# Patient Record
Sex: Male | Born: 1945
Health system: Southern US, Community
[De-identification: ages and names within clinical notes are randomized; demographics above are authoritative.]

## PROBLEM LIST (undated history)

## (undated) DIAGNOSIS — F1010 Alcohol abuse, uncomplicated: Secondary | ICD-10-CM

## (undated) DIAGNOSIS — Z5900 Homelessness unspecified: Secondary | ICD-10-CM

## (undated) DIAGNOSIS — R0600 Dyspnea, unspecified: Secondary | ICD-10-CM

## (undated) DIAGNOSIS — I10 Essential (primary) hypertension: Secondary | ICD-10-CM

## (undated) HISTORY — PX: NO PAST SURGERIES: SHX2092

---

## 1999-09-24 ENCOUNTER — Emergency Department (HOSPITAL_COMMUNITY): Admission: EM | Admit: 1999-09-24 | Discharge: 1999-09-24 | Payer: Self-pay | Admitting: *Deleted

## 1999-09-24 ENCOUNTER — Encounter: Payer: Self-pay | Admitting: *Deleted

## 1999-12-03 ENCOUNTER — Emergency Department (HOSPITAL_COMMUNITY): Admission: EM | Admit: 1999-12-03 | Discharge: 1999-12-03 | Payer: Self-pay | Admitting: Emergency Medicine

## 2012-08-01 ENCOUNTER — Emergency Department (HOSPITAL_COMMUNITY)
Admission: EM | Admit: 2012-08-01 | Discharge: 2012-08-01 | Disposition: A | Discharge status: Home / Self Care | Payer: Self-pay | Attending: Emergency Medicine | Admitting: Emergency Medicine

## 2012-08-01 ENCOUNTER — Encounter (HOSPITAL_COMMUNITY): Payer: Self-pay | Admitting: Emergency Medicine

## 2012-08-01 DIAGNOSIS — W010XXA Fall on same level from slipping, tripping and stumbling without subsequent striking against object, initial encounter: Secondary | ICD-10-CM | POA: Insufficient documentation

## 2012-08-01 DIAGNOSIS — Z23 Encounter for immunization: Secondary | ICD-10-CM | POA: Insufficient documentation

## 2012-08-01 DIAGNOSIS — Y9389 Activity, other specified: Secondary | ICD-10-CM | POA: Insufficient documentation

## 2012-08-01 DIAGNOSIS — Y929 Unspecified place or not applicable: Secondary | ICD-10-CM | POA: Insufficient documentation

## 2012-08-01 DIAGNOSIS — F17200 Nicotine dependence, unspecified, uncomplicated: Secondary | ICD-10-CM | POA: Insufficient documentation

## 2012-08-01 DIAGNOSIS — S61419A Laceration without foreign body of unspecified hand, initial encounter: Secondary | ICD-10-CM

## 2012-08-01 DIAGNOSIS — S61409A Unspecified open wound of unspecified hand, initial encounter: Secondary | ICD-10-CM | POA: Insufficient documentation

## 2012-08-01 MED ORDER — TETANUS-DIPHTH-ACELL PERTUSSIS 5-2.5-18.5 LF-MCG/0.5 IM SUSP
0.5000 mL | Freq: Once | INTRAMUSCULAR | Status: AC
  Administered 2012-08-01: 0.5 mL via INTRAMUSCULAR

## 2012-08-01 MED ORDER — CEPHALEXIN 500 MG PO CAPS: 500.0000 mg | ORAL_CAPSULE | Freq: Four times a day (QID) | ORAL | Status: DC

## 2012-08-01 MED ORDER — TETANUS-DIPHTHERIA TOXOIDS TD 5-2 LFU IM INJ
0.5000 mL | INJECTION | Freq: Once | INTRAMUSCULAR | Status: DC
  Filled 2012-08-01: qty 0.5

## 2012-08-01 NOTE — ED Notes (Signed)
Pt called but no answer in lobby.

## 2012-08-01 NOTE — ED Notes (Signed)
Deep laceration to right hand. States he fell on glass last night about 0100. "Didn't notice that it was so bad". Last tetanus "many years ago".

## 2012-08-01 NOTE — ED Provider Notes (Signed)
History   This chart was scribed for Steven Shi, MD, by Frederik Pear, ER scribe. The patient was seen in room TR07C/TR07C and the patient's care was started at 1239.    CSN: [id number]  Arrival date & time 08/01/12  1239   None     Chief Complaint  Patient presents with  . Laceration    (Consider location/radiation/quality/duration/timing/severity/associated sxs/prior treatment) HPI  Steven Arnold is a 66 y.o. male brought in by EMS who presents to the Emergency Department complaining of a constant, deep laceration to the right hand that occurred last night around 0100 after he tripped and fell on some glass. EMS reports that the bleeding is controlled and applied a bandage. He denies having a current tetanus shot.   History reviewed. No pertinent past medical history.  History reviewed. No pertinent past surgical history.  No family history on file.  History  Substance Use Topics  . Smoking status: Light Tobacco Smoker  . Smokeless tobacco: Not on file  . Alcohol Use: Yes      Review of Systems A complete 10 system review of systems was obtained and all systems are negative except as noted in the HPI and PMH.   Allergies  Review of patient's allergies indicates no known allergies.  Home Medications   Current Outpatient Rx  Name  Route  Sig  Dispense  Refill  . CEPHALEXIN 500 MG PO CAPS   Oral   Take 1 capsule (500 mg total) by mouth 4 (four) times daily.   20 capsule   0     BP 145/86  Pulse 92  Temp 98.9 F (37.2 C) (Oral)  Resp 16  SpO2 99%  Physical Exam  Nursing note and vitals reviewed. Constitutional: He is oriented to person, place, and time. He appears well-developed and well-nourished. No distress.  HENT:  Head: Normocephalic and atraumatic.  Eyes: Pupils are equal, round, and reactive to light.  Neck: Normal range of motion.  Cardiovascular: Normal rate and intact distal pulses.   Pulmonary/Chest: No respiratory distress.    Abdominal: Normal appearance. He exhibits no distension.  Musculoskeletal: Normal range of motion.       Hands: Neurological: He is alert and oriented to person, place, and time. No cranial nerve deficit.  Skin: Skin is warm and dry. No rash noted.  Psychiatric: He has a normal mood and affect. His behavior is normal.    ED Course  LACERATION REPAIR Performed by: Steven Arnold Authorized by: Steven Arnold Consent: Verbal consent obtained. Risks and benefits: risks, benefits and alternatives were discussed Patient understanding: patient states understanding of the procedure being performed Body area: upper extremity Location details: right hand Laceration length: 6 cm Contamination: The wound is contaminated. Foreign bodies: no foreign bodies Tendon involvement: none Nerve involvement: none Vascular damage: no Anesthesia: local infiltration Local anesthetic: lidocaine 2% without epinephrine Anesthetic total: 7 ml Patient sedated: no Preparation: Patient was prepped and draped in the usual sterile fashion. Irrigation method: jet lavage Amount of cleaning: extensive Debridement: none Degree of undermining: none Skin closure: 4-0 nylon Number of sutures: 8 Technique: running Dressing: antibiotic ointment Patient tolerance: Patient tolerated the procedure well with no immediate complications.   (including critical care time)  DIAGNOSTIC STUDIES: Oxygen Saturation is 99% on room air, normal by my interpretation.    COORDINATION OF CARE:  13:17- Discussed planned course of treatment with the patient, including a laceration repair, who is agreeable at this time.   Labs  Reviewed - No data to display No results found.   1. Hand laceration       MDM  I personally performed the services described in this documentation, which was scribed in my presence. The recorded information has been reviewed and considered.       Steven Shi, MD 08/01/12 2225

## 2012-08-01 NOTE — ED Notes (Signed)
EMS vital signs BP 142/90, HR 70 RR 16 98% room air

## 2012-08-01 NOTE — ED Notes (Addendum)
Patient tripped fell last night 0000 right hand laceration EMS reported 2 inches deep controlled with bandaged applied by EMS radial pulse +2. Full sensation

## 2014-08-10 ENCOUNTER — Emergency Department (HOSPITAL_COMMUNITY): Payer: Commercial Managed Care - HMO

## 2014-08-10 ENCOUNTER — Emergency Department (HOSPITAL_COMMUNITY)
Admission: EM | Admit: 2014-08-10 | Discharge: 2014-08-11 | Disposition: A | Discharge status: Home / Self Care | Payer: Commercial Managed Care - HMO | Attending: Emergency Medicine | Admitting: Emergency Medicine

## 2014-08-10 ENCOUNTER — Encounter (HOSPITAL_COMMUNITY): Payer: Self-pay | Admitting: Emergency Medicine

## 2014-08-10 DIAGNOSIS — Y998 Other external cause status: Secondary | ICD-10-CM | POA: Insufficient documentation

## 2014-08-10 DIAGNOSIS — Z792 Long term (current) use of antibiotics: Secondary | ICD-10-CM | POA: Insufficient documentation

## 2014-08-10 DIAGNOSIS — Y9389 Activity, other specified: Secondary | ICD-10-CM | POA: Insufficient documentation

## 2014-08-10 DIAGNOSIS — F10920 Alcohol use, unspecified with intoxication, uncomplicated: Secondary | ICD-10-CM

## 2014-08-10 DIAGNOSIS — F10120 Alcohol abuse with intoxication, uncomplicated: Secondary | ICD-10-CM | POA: Insufficient documentation

## 2014-08-10 DIAGNOSIS — W19XXXA Unspecified fall, initial encounter: Secondary | ICD-10-CM

## 2014-08-10 DIAGNOSIS — W08XXXA Fall from other furniture, initial encounter: Secondary | ICD-10-CM | POA: Insufficient documentation

## 2014-08-10 DIAGNOSIS — Y92149 Unspecified place in prison as the place of occurrence of the external cause: Secondary | ICD-10-CM | POA: Insufficient documentation

## 2014-08-10 DIAGNOSIS — Z720 Tobacco use: Secondary | ICD-10-CM | POA: Insufficient documentation

## 2014-08-10 DIAGNOSIS — S4992XA Unspecified injury of left shoulder and upper arm, initial encounter: Secondary | ICD-10-CM | POA: Diagnosis not present

## 2014-08-10 NOTE — ED Notes (Addendum)
Pt [facility] EMS from jail, deputies report pt fell from bench onto the floor. Jail unwilling to keep patient in drunk tank due to intoxication.  Pt rambling, unable to understand completely what patient is attempting to explain, pt holding L shoulder.

## 2014-08-10 NOTE — ED Provider Notes (Signed)
CSN: [id number]     Arrival date & time 08/10/14  2128 History   First MD Initiated Contact with Patient 08/10/14 2248     Chief Complaint  Patient presents with  . Alcohol Intoxication  . Fall     (Consider location/radiation/quality/duration/timing/severity/associated sxs/prior Treatment) HPI Comments: Patient here from jail after falling from a bench onto the floor. No loss of consciousness. No head injury. Pain of left shoulder pain. Denies any distal numbness or tingling. No hip or back or chest pain. Denies any abdominal pain. No treatment use prior to arrival and patient transported here for further evaluation. He admits to drinking copious amounts of alcohol this evening  Patient is a 69 y.o. male presenting with intoxication and fall. The history is provided by the patient.  Alcohol Intoxication  Fall    History reviewed. No pertinent past medical history. History reviewed. No pertinent past surgical history. No family history on file. History  Substance Use Topics  . Smoking status: Light Tobacco Smoker  . Smokeless tobacco: Not on file  . Alcohol Use: Yes    Review of Systems  All other systems reviewed and are negative.     Allergies  Review of patient's allergies indicates no known allergies.  Home Medications   Prior to Admission medications   Medication Sig Start Date End Date Taking? Authorizing Provider  cephALEXin (KEFLEX) 500 MG capsule Take 1 capsule (500 mg total) by mouth 4 (four) times daily. 08/01/12   Nelia Shi, MD   BP 170/96 mmHg  Pulse 102  Temp(Src) 97.5 F (36.4 C) (Oral)  Resp 20  Wt 145 lb (65.772 kg)  SpO2 94% Physical Exam  Constitutional: He is oriented to person, place, and time. He appears well-developed and well-nourished.  Non-toxic appearance. No distress.  HENT:  Head: Normocephalic and atraumatic.  Eyes: Conjunctivae, EOM and lids are normal. Pupils are equal, round, and reactive to light.  Neck: Normal range of  motion. Neck supple. No tracheal deviation present. No thyroid mass present.  Cardiovascular: Normal rate, regular rhythm and normal heart sounds.  Exam reveals no gallop.   No murmur heard. Pulmonary/Chest: Effort normal and breath sounds normal. No stridor. No respiratory distress. He has no decreased breath sounds. He has no wheezes. He has no rhonchi. He has no rales.  Abdominal: Soft. Normal appearance and bowel sounds are normal. He exhibits no distension. There is no tenderness. There is no rebound and no CVA tenderness.  Musculoskeletal: Normal range of motion. He exhibits no edema or tenderness.       Left shoulder: He exhibits pain. He exhibits normal range of motion, no bony tenderness, no swelling, no deformity and normal strength.  Neurological: He is alert and oriented to person, place, and time. He has normal strength. No cranial nerve deficit or sensory deficit. GCS eye subscore is 4. GCS verbal subscore is 5. GCS motor subscore is 6.  Skin: Skin is warm and dry. No abrasion and no rash noted.  Psychiatric: He has a normal mood and affect. His speech is normal and behavior is normal.  Nursing note and vitals reviewed.   ED Course  Procedures (including critical care time) Labs Review Labs Reviewed - No data to display  Imaging Review Dg Shoulder Left  08/10/2014   CLINICAL DATA:  Fall. Patient is intoxicated difficult to understand. Holding the left shoulder.  EXAM: LEFT SHOULDER - 2+ VIEW  COMPARISON:  None.  FINDINGS: Degenerative changes in the glenohumeral joint. No evidence  of acute fracture or dislocation. Exostosis arising from the mid humeral shaft. No destructive bone lesion.  IMPRESSION: Degenerative changes in the left shoulder. No acute fracture or dislocation.   Electronically Signed   By: Burman NievesWilliam  Stevens M.D.   On: 08/10/2014 22:17     EKG Interpretation None      MDM   Final diagnoses:  Fall    Left shoulder x-rays negative. Patient clinically  intoxicated and will be allowed to sober up    Toy BakerAnthony T Rulon Abdalla, MD 08/10/14 [phone]

## 2014-08-11 NOTE — ED Provider Notes (Signed)
Pt ambulated to the bathroom on his own without significant difficulty. Dc home at this time  Lyanne CoKevin M Linzy Darling, MD 08/11/14 0040

## 2017-12-23 DIAGNOSIS — R69 Illness, unspecified: Secondary | ICD-10-CM | POA: Diagnosis not present

## 2018-04-10 ENCOUNTER — Emergency Department (HOSPITAL_COMMUNITY)
Admission: EM | Admit: 2018-04-10 | Discharge: 2018-04-10 | Disposition: A | Discharge status: Home / Self Care | Payer: Medicare HMO | Attending: Emergency Medicine | Admitting: Emergency Medicine

## 2018-04-10 ENCOUNTER — Encounter (HOSPITAL_COMMUNITY): Payer: Self-pay

## 2018-04-10 ENCOUNTER — Emergency Department (HOSPITAL_COMMUNITY): Payer: Medicare HMO

## 2018-04-10 ENCOUNTER — Other Ambulatory Visit: Payer: Self-pay

## 2018-04-10 DIAGNOSIS — F17210 Nicotine dependence, cigarettes, uncomplicated: Secondary | ICD-10-CM | POA: Diagnosis not present

## 2018-04-10 DIAGNOSIS — W19XXXA Unspecified fall, initial encounter: Secondary | ICD-10-CM | POA: Diagnosis not present

## 2018-04-10 DIAGNOSIS — Y92480 Sidewalk as the place of occurrence of the external cause: Secondary | ICD-10-CM | POA: Diagnosis not present

## 2018-04-10 DIAGNOSIS — Y939 Activity, unspecified: Secondary | ICD-10-CM | POA: Diagnosis not present

## 2018-04-10 DIAGNOSIS — R51 Headache: Secondary | ICD-10-CM | POA: Diagnosis not present

## 2018-04-10 DIAGNOSIS — S0993XA Unspecified injury of face, initial encounter: Secondary | ICD-10-CM | POA: Diagnosis not present

## 2018-04-10 DIAGNOSIS — Z23 Encounter for immunization: Secondary | ICD-10-CM | POA: Insufficient documentation

## 2018-04-10 DIAGNOSIS — T679XXA Effect of heat and light, unspecified, initial encounter: Secondary | ICD-10-CM | POA: Diagnosis not present

## 2018-04-10 DIAGNOSIS — Y999 Unspecified external cause status: Secondary | ICD-10-CM | POA: Diagnosis not present

## 2018-04-10 DIAGNOSIS — R000 Tachycardia, unspecified: Secondary | ICD-10-CM | POA: Diagnosis not present

## 2018-04-10 DIAGNOSIS — S0990XA Unspecified injury of head, initial encounter: Secondary | ICD-10-CM | POA: Diagnosis not present

## 2018-04-10 DIAGNOSIS — R42 Dizziness and giddiness: Secondary | ICD-10-CM | POA: Diagnosis not present

## 2018-04-10 DIAGNOSIS — S01112A Laceration without foreign body of left eyelid and periocular area, initial encounter: Secondary | ICD-10-CM

## 2018-04-10 DIAGNOSIS — S199XXA Unspecified injury of neck, initial encounter: Secondary | ICD-10-CM | POA: Diagnosis not present

## 2018-04-10 DIAGNOSIS — W101XXA Fall (on)(from) sidewalk curb, initial encounter: Secondary | ICD-10-CM | POA: Insufficient documentation

## 2018-04-10 DIAGNOSIS — R55 Syncope and collapse: Secondary | ICD-10-CM | POA: Diagnosis not present

## 2018-04-10 LAB — CBC WITH DIFFERENTIAL/PLATELET
ABS IMMATURE GRANULOCYTES: 0 10*3/uL (ref 0.0–0.1)
Basophils Absolute: 0 10*3/uL (ref 0.0–0.1)
Basophils Relative: 1 %
Eosinophils Absolute: 0.1 10*3/uL (ref 0.0–0.7)
Eosinophils Relative: 2 %
HEMATOCRIT: 34.4 % — AB (ref 39.0–52.0)
Hemoglobin: 11.3 g/dL — ABNORMAL LOW (ref 13.0–17.0)
IMMATURE GRANULOCYTES: 1 %
LYMPHS ABS: 0.8 10*3/uL (ref 0.7–4.0)
Lymphocytes Relative: 22 %
MCH: 33.6 pg (ref 26.0–34.0)
MCHC: 32.8 g/dL (ref 30.0–36.0)
MCV: 102.4 fL — AB (ref 78.0–100.0)
MONO ABS: 0.4 10*3/uL (ref 0.1–1.0)
MONOS PCT: 12 %
NEUTROS ABS: 2.3 10*3/uL (ref 1.7–7.7)
NEUTROS PCT: 62 %
Platelets: 160 10*3/uL (ref 150–400)
RBC: 3.36 MIL/uL — ABNORMAL LOW (ref 4.22–5.81)
RDW: 12.5 % (ref 11.5–15.5)
WBC: 3.7 10*3/uL — ABNORMAL LOW (ref 4.0–10.5)

## 2018-04-10 LAB — BASIC METABOLIC PANEL
ANION GAP: 14 (ref 5–15)
BUN: 16 mg/dL (ref 8–23)
CO2: 21 mmol/L — AB (ref 22–32)
Calcium: 8.8 mg/dL — ABNORMAL LOW (ref 8.9–10.3)
Chloride: 99 mmol/L (ref 98–111)
Creatinine, Ser: 1.21 mg/dL (ref 0.61–1.24)
GFR calc Af Amer: >60 mL/min (ref >60)
GFR calc non Af Amer: 59 mL/min — ABNORMAL LOW (ref >60)
GLUCOSE: 113 mg/dL — AB (ref 70–99)
POTASSIUM: 3.5 mmol/L (ref 3.5–5.1)
Sodium: 134 mmol/L — ABNORMAL LOW (ref 135–145)

## 2018-04-10 LAB — ETHANOL: Alcohol, Ethyl (B): <10 mg/dL (ref <10)

## 2018-04-10 MED ORDER — LIDOCAINE-EPINEPHRINE 2 %-1:100000 IJ SOLN
INTRAMUSCULAR | Status: AC
  Filled 2018-04-10: qty 1

## 2018-04-10 MED ORDER — FLUORESCEIN SODIUM 1 MG OP STRP
1.0000 | ORAL_STRIP | Freq: Once | OPHTHALMIC | Status: AC
  Administered 2018-04-10: 1 via OPHTHALMIC
  Filled 2018-04-10: qty 1

## 2018-04-10 MED ORDER — LORAZEPAM 1 MG PO TABS
1.0000 mg | ORAL_TABLET | Freq: Once | ORAL | Status: AC
  Administered 2018-04-10: 1 mg via ORAL
  Filled 2018-04-10: qty 1

## 2018-04-10 MED ORDER — ERYTHROMYCIN 5 MG/GM OP OINT
1.0000 "application " | TOPICAL_OINTMENT | Freq: Once | OPHTHALMIC | Status: AC
  Administered 2018-04-10: 1 via OPHTHALMIC

## 2018-04-10 MED ORDER — ERYTHROMYCIN 5 MG/GM OP OINT: 1.0000 "application " | TOPICAL_OINTMENT | Freq: Three times a day (TID) | OPHTHALMIC | 1 refills | Status: AC

## 2018-04-10 MED ORDER — TETANUS-DIPHTH-ACELL PERTUSSIS 5-2.5-18.5 LF-MCG/0.5 IM SUSP
0.5000 mL | Freq: Once | INTRAMUSCULAR | Status: AC
  Administered 2018-04-10: 0.5 mL via INTRAMUSCULAR
  Filled 2018-04-10: qty 0.5

## 2018-04-10 MED ORDER — TOBRAMYCIN-DEXAMETHASONE 0.3-0.1 % OP OINT
TOPICAL_OINTMENT | OPHTHALMIC | Status: AC
  Filled 2018-04-10: qty 3.5

## 2018-04-10 MED ORDER — LIDOCAINE HCL (PF) 1 % IJ SOLN
5.0000 mL | Freq: Once | INTRAMUSCULAR | Status: AC
  Administered 2018-04-10: 5 mL via INTRADERMAL
  Filled 2018-04-10: qty 5

## 2018-04-10 MED ORDER — BSS IO SOLN
INTRAOCULAR | Status: AC
  Filled 2018-04-10: qty 30

## 2018-04-10 MED ORDER — LIDOCAINE-EPINEPHRINE (PF) 2 %-1:200000 IJ SOLN
10.0000 mL | Freq: Once | INTRAMUSCULAR | Status: AC
  Administered 2018-04-10: 10 mL via INTRADERMAL

## 2018-04-10 MED ORDER — TETRACAINE HCL 0.5 % OP SOLN
1.0000 [drp] | Freq: Once | OPHTHALMIC | Status: AC
  Administered 2018-04-10: 1 [drp] via OPHTHALMIC
  Filled 2018-04-10: qty 4

## 2018-04-10 NOTE — Discharge Instructions (Addendum)
Dr. Allena Katz has evaluated your eye and has fixed your laceration This will take some time to heal - you should follow up with the specialist above for further evaluation and suture removal. If you can't see the specialist above, you can see Dr. Allena Katz for follow up  ER for worsening symptoms.

## 2018-04-10 NOTE — Consult Note (Signed)
[facility]                                                                               04/10/2018                                               Ophthalmology Consultation                                         Consult requested by: Dr. Hyacinth Meeker   Reason for consultation:  Left lower eyelid trauma with eyelid laceration  HPI: 72 y.o male states he was bending over to pick up change, lost balance and fell forward, striking his face on gravel.  He denies LOC.  He drinks alcohol nightly.  Pertinent Medical History:   Active Ambulatory Problems    Diagnosis Date Noted  . No Active Ambulatory Problems   Resolved Ambulatory Problems    Diagnosis Date Noted  . No Resolved Ambulatory Problems   No Additional Past Medical History    Pertinent Ophthalmic History: None    Current Eye Medications: none  Systemic medications on admission:   (Not in a hospital admission)     ROS: Negative (GI/GU/Neuro/CV/Resp/HENT/Integ/Psych/MS) Positive for eyelid injury without blurry vision  Visual Fields: FTC OU      Pupils:  Pharmacologically dilated at my direction before exam   Equal, brisk, no APD     Near acuity:   J2     J2  TA:       Normal to palpation OU      External:   OD:  Normal     OS:  Eyelid laceration - full thickness with abrasion to adjacent skin  Anterior segment exam:  By penlight     Conjunctiva:  OD:  Quiet      OS:  Quiet     Cornea:    OD: Clear, no fluorescein stain       OS: Clear, no fluorescein stain      Anterior Chamber:   OD:  Deep/quiet      OS:  Deep/quiet     Iris:    OD:  Normal       OS:  Normal      Lens:    OD:          OS:            Impression:  Left lower eyelid laceration - full thickness Left lower eyelid skin abrasion Left eye trauma Left eyelid pain     Recommendations/Plan:    Left lower eyelid laceration repair Apply topical Erythromycin to wound and skin 3-4x/day until healed.  Procedure note:  Left lower  eyelid laceration repair The left lower eyelid was prepped and cleaned with betadine.  A fenestrated drape was placed.  2% lidocaine was injected to attain anesthesia.  5-0 Vicryl was placed through the tarsus to reapproximate the displaced tarsal laceration.  7-0 vicryl was placed through  the eyelid margin the reapproximate the margin.  These suture were tied in place.  Multiple interrupted 7-0 Vicryl sutures were placed to close the overlying skin.  The eye was cleaned and erythromycin ointment placed over the wound and open skin.  I've discussed these findings with the attending. Please contact our office with any questions or concerns at [phone].   Harrold Donath

## 2018-04-10 NOTE — ED Provider Notes (Signed)
[facility] EMERGENCY DEPARTMENT Provider Note   CSN: [id number] Arrival date & time: 04/10/18  1710     History   Chief Complaint Chief Complaint  Patient presents with  . Loss of Consciousness    HPI Steven Arnold is a 72 y.o. male.  HPI  72 year old male, history of alcohol abuse, denies any other chronic medical conditions, states that he was on the sidewalk bending over to pick up some change when he lost his balance falling forward and striking his left periorbital area in the gravel.  There is a question as to whether there is a loss of consciousness though the patient denies this of the head minimally.  He does endorse drinking alcohol nightly, it seems like he drinks heavily, he states he had one 40 ounce beer last night.  The patient denies any other chronic medical history, takes no daily medicines, and has no other injuries other than his face.  He has no other complaints other than his left periorbital injury.  No changes in vision.  Symptoms were acute in onset and occurred just prior to arrival.  History reviewed. No pertinent past medical history.  There are no active problems to display for this patient.   History reviewed. No pertinent surgical history.      Home Medications    Prior to Admission medications   Medication Sig Start Date End Date Taking? Authorizing Provider  cephALEXin (KEFLEX) 500 MG capsule Take 1 capsule (500 mg total) by mouth 4 (four) times daily. 08/01/12   Nelva Nay, MD  erythromycin ophthalmic ointment Place 1 application into the left eye 3 (three) times daily for 5 days. Place 1/2 inch ribbon of ointment in the affected eye 4 times a day 04/10/18 04/15/18  Eber Hong, MD    Family History History reviewed. No pertinent family history.  Social History Social History   Tobacco Use  . Smoking status: Heavy Tobacco Smoker    Packs/day: 0.50  . Smokeless tobacco: Never Used  Substance Use Topics  .  Alcohol use: Yes    Alcohol/week: 6.0 standard drinks    Types: 6 Cans of beer per week  . Drug use: No     Allergies   Patient has no known allergies.   Review of Systems Review of Systems  All other systems reviewed and are negative.    Physical Exam Updated Vital Signs BP (!) 159/98   Pulse 73   Resp 18   Ht 1.676 m (5\' 6" )   Wt 65.8 kg   SpO2 99%   BMI 23.40 kg/m   Physical Exam  Constitutional: He appears well-developed and well-nourished. No distress.  HENT:  Head: Normocephalic.  Mouth/Throat: Oropharynx is clear and moist. No oropharyngeal exudate.  Laceration involving the lower lid at the lid margin extending onto the lower lid with some degloving of the lower lid and puncture type bleeding to the area just superior to the left brow.  Eyes: Pupils are equal, round, and reactive to light. Conjunctivae and EOM are normal. Right eye exhibits no discharge. Left eye exhibits no discharge. No scleral icterus.  Periorbital injury as discussed above  Neck: No JVD present. No thyromegaly present.  Mild tenderness over the cervical spine  Cardiovascular: Normal rate, regular rhythm, normal heart sounds and intact distal pulses. Exam reveals no gallop and no friction rub.  No murmur heard. Pulmonary/Chest: Effort normal and breath sounds normal. No respiratory distress. He has no wheezes. He has no rales.  Abdominal: Soft. Bowel sounds are normal. He exhibits no distension and no mass. There is no tenderness.  Musculoskeletal: Normal range of motion. He exhibits no edema or tenderness.  All 4 extremities with soft compartments and supple joints diffusely, able to move all 4 extremities with normal range of motion and strength  Lymphadenopathy:    He has no cervical adenopathy.  Neurological: He is alert. Coordination normal.  Mild tremor of his upper extremities, speech is clear, no facial droop, answers questions and follows commands without difficulty, seemingly normal  strength diffusely  Skin: Skin is warm and dry. No rash noted. No erythema.  Laceration as noted above.  Psychiatric: He has a normal mood and affect. His behavior is normal.  Nursing note and vitals reviewed.    ED Treatments / Results  Labs (all labs ordered are listed, but only abnormal results are displayed) Labs Reviewed  CBC WITH DIFFERENTIAL/PLATELET - Abnormal; Notable for the following components:      Result Value   WBC 3.7 (*)    RBC 3.36 (*)    Hemoglobin 11.3 (*)    HCT 34.4 (*)    MCV 102.4 (*)    All other components within normal limits  BASIC METABOLIC PANEL - Abnormal; Notable for the following components:   Sodium 134 (*)    CO2 21 (*)    Glucose, Bld 113 (*)    Calcium 8.8 (*)    GFR calc non Af Amer 59 (*)    All other components within normal limits  ETHANOL    EKG EKG Interpretation  Date/Time:  Sunday April 10 2018 17:17:59 EDT Ventricular Rate:  85 PR Interval:    QRS Duration: 89 QT Interval:  399 QTC Calculation: 475 R Axis:   68 Text Interpretation:  Sinus rhythm Left ventricular hypertrophy since 2001, no significant changes seen Confirmed by Eber Hong ([id number]) on 04/10/2018 5:53:21 PM   Radiology Ct Head Wo Contrast  Result Date: 04/10/2018 CLINICAL DATA:  Syncopal episode resulting in a fall, hitting his left face on asphalt with a resultant left inferior eyelid laceration. Headache. EXAM: CT HEAD WITHOUT CONTRAST CT MAXILLOFACIAL WITHOUT CONTRAST CT CERVICAL SPINE WITHOUT CONTRAST TECHNIQUE: Multidetector CT imaging of the head, cervical spine, and maxillofacial structures were performed using the standard protocol without intravenous contrast. Multiplanar CT image reconstructions of the cervical spine and maxillofacial structures were also generated. COMPARISON:  None. FINDINGS: CT HEAD FINDINGS Brain: Diffusely enlarged ventricles and subarachnoid spaces. Patchy white matter low density in both cerebral hemispheres. No intracranial  hemorrhage, mass lesion or CT evidence of acute infarction. Vascular: No hyperdense vessel or unexpected calcification. Skull: Normal. Negative for fracture or focal lesion. Other: None. CT MAXILLOFACIAL FINDINGS Osseous: No fractures. Orbits: Negative. No traumatic or inflammatory finding. Sinuses: Mild right maxillary sinus mucosal thickening. Small left maxillary sinus retention cyst. Soft tissues: Mild left periorbital soft tissue swelling and inferior laceration. Mild skin irregularity of the left cheek area with small superficial foreign bodies. CT CERVICAL SPINE FINDINGS Alignment: Mild reversal of the normal cervical lordosis. No subluxations. Skull base and vertebrae: No acute fracture. No primary bone lesion or focal pathologic process. Soft tissues and spinal canal: No prevertebral fluid or swelling. No visible canal hematoma. Disc levels: Multilevel degenerative changes. Anterior bone fusion at the C5-6 and C6-7 levels. Upper chest: Clear lung apices. Other: Bilateral carotid artery calcifications. IMPRESSION: 1. No skull fracture or intracranial hemorrhage. 2. No maxillofacial fracture. 3. No cervical spine fracture or subluxation. 4. Mild  to moderate diffuse cerebral and cerebellar atrophy. 5. Mild chronic small vessel white matter ischemic changes in both cerebral hemispheres. 6. Cervical spine degenerative changes. 7. Bilateral carotid artery atheromatous calcifications. 8. Mild chronic right maxillary sinusitis. 9. Left cheek abrasions with small superficial foreign bodies. Electronically Signed   By: Beckie Salts M.D.   On: 04/10/2018 19:34   Ct Cervical Spine Wo Contrast  Result Date: 04/10/2018 CLINICAL DATA:  Syncopal episode resulting in a fall, hitting his left face on asphalt with a resultant left inferior eyelid laceration. Headache. EXAM: CT HEAD WITHOUT CONTRAST CT MAXILLOFACIAL WITHOUT CONTRAST CT CERVICAL SPINE WITHOUT CONTRAST TECHNIQUE: Multidetector CT imaging of the head, cervical  spine, and maxillofacial structures were performed using the standard protocol without intravenous contrast. Multiplanar CT image reconstructions of the cervical spine and maxillofacial structures were also generated. COMPARISON:  None. FINDINGS: CT HEAD FINDINGS Brain: Diffusely enlarged ventricles and subarachnoid spaces. Patchy white matter low density in both cerebral hemispheres. No intracranial hemorrhage, mass lesion or CT evidence of acute infarction. Vascular: No hyperdense vessel or unexpected calcification. Skull: Normal. Negative for fracture or focal lesion. Other: None. CT MAXILLOFACIAL FINDINGS Osseous: No fractures. Orbits: Negative. No traumatic or inflammatory finding. Sinuses: Mild right maxillary sinus mucosal thickening. Small left maxillary sinus retention cyst. Soft tissues: Mild left periorbital soft tissue swelling and inferior laceration. Mild skin irregularity of the left cheek area with small superficial foreign bodies. CT CERVICAL SPINE FINDINGS Alignment: Mild reversal of the normal cervical lordosis. No subluxations. Skull base and vertebrae: No acute fracture. No primary bone lesion or focal pathologic process. Soft tissues and spinal canal: No prevertebral fluid or swelling. No visible canal hematoma. Disc levels: Multilevel degenerative changes. Anterior bone fusion at the C5-6 and C6-7 levels. Upper chest: Clear lung apices. Other: Bilateral carotid artery calcifications. IMPRESSION: 1. No skull fracture or intracranial hemorrhage. 2. No maxillofacial fracture. 3. No cervical spine fracture or subluxation. 4. Mild to moderate diffuse cerebral and cerebellar atrophy. 5. Mild chronic small vessel white matter ischemic changes in both cerebral hemispheres. 6. Cervical spine degenerative changes. 7. Bilateral carotid artery atheromatous calcifications. 8. Mild chronic right maxillary sinusitis. 9. Left cheek abrasions with small superficial foreign bodies. Electronically Signed   By:  Beckie Salts M.D.   On: 04/10/2018 19:34   Ct Maxillofacial Wo Contrast  Result Date: 04/10/2018 CLINICAL DATA:  Syncopal episode resulting in a fall, hitting his left face on asphalt with a resultant left inferior eyelid laceration. Headache. EXAM: CT HEAD WITHOUT CONTRAST CT MAXILLOFACIAL WITHOUT CONTRAST CT CERVICAL SPINE WITHOUT CONTRAST TECHNIQUE: Multidetector CT imaging of the head, cervical spine, and maxillofacial structures were performed using the standard protocol without intravenous contrast. Multiplanar CT image reconstructions of the cervical spine and maxillofacial structures were also generated. COMPARISON:  None. FINDINGS: CT HEAD FINDINGS Brain: Diffusely enlarged ventricles and subarachnoid spaces. Patchy white matter low density in both cerebral hemispheres. No intracranial hemorrhage, mass lesion or CT evidence of acute infarction. Vascular: No hyperdense vessel or unexpected calcification. Skull: Normal. Negative for fracture or focal lesion. Other: None. CT MAXILLOFACIAL FINDINGS Osseous: No fractures. Orbits: Negative. No traumatic or inflammatory finding. Sinuses: Mild right maxillary sinus mucosal thickening. Small left maxillary sinus retention cyst. Soft tissues: Mild left periorbital soft tissue swelling and inferior laceration. Mild skin irregularity of the left cheek area with small superficial foreign bodies. CT CERVICAL SPINE FINDINGS Alignment: Mild reversal of the normal cervical lordosis. No subluxations. Skull base and vertebrae: No acute fracture. No primary  bone lesion or focal pathologic process. Soft tissues and spinal canal: No prevertebral fluid or swelling. No visible canal hematoma. Disc levels: Multilevel degenerative changes. Anterior bone fusion at the C5-6 and C6-7 levels. Upper chest: Clear lung apices. Other: Bilateral carotid artery calcifications. IMPRESSION: 1. No skull fracture or intracranial hemorrhage. 2. No maxillofacial fracture. 3. No cervical spine  fracture or subluxation. 4. Mild to moderate diffuse cerebral and cerebellar atrophy. 5. Mild chronic small vessel white matter ischemic changes in both cerebral hemispheres. 6. Cervical spine degenerative changes. 7. Bilateral carotid artery atheromatous calcifications. 8. Mild chronic right maxillary sinusitis. 9. Left cheek abrasions with small superficial foreign bodies. Electronically Signed   By: Beckie Salts M.D.   On: 04/10/2018 19:34    Procedures Procedures (including critical care time)  Medications Ordered in ED Medications  Tdap (BOOSTRIX) injection 0.5 mL (0.5 mLs Intramuscular Given 04/10/18 1817)  lidocaine (PF) (XYLOCAINE) 1 % injection 5 mL (5 mLs Intradermal Given 04/10/18 1815)  tetracaine (PONTOCAINE) 0.5 % ophthalmic solution 1 drop (1 drop Both Eyes Given 04/10/18 1832)  fluorescein ophthalmic strip 1 strip (1 strip Both Eyes Given 04/10/18 1832)  LORazepam (ATIVAN) tablet 1 mg (1 mg Oral Given 04/10/18 1832)     Initial Impression / Assessment and Plan / ED Course  I have reviewed the triage vital signs and the nursing notes.  Pertinent labs & imaging results that were available during my care of the patient were reviewed by me and considered in my medical decision making (see chart for details).         We will discuss with ophthalmology regarding potential lid and facial repair, does not seem to involve the eye conjunctive etc.  Will need formal fluorescein exam.  Discussed with Dr. Allena Katz (Optho) who will come to see the patient  Dr. Allena Katz has very kindly fix the patient's laceration, has referred to plastics ophthalmology, Dr. Dimas Millin, will prescribe erythromycin ointment, the patient instructed on wound care, stable for discharge  Final Clinical Impressions(s) / ED Diagnoses   Final diagnoses:  Left eyelid laceration, initial encounter    ED Discharge Orders         Ordered    erythromycin ophthalmic ointment  3 times daily     04/10/18 2128             Eber Hong, MD 04/10/18 2131

## 2018-04-10 NOTE — ED Triage Notes (Signed)
Per EMS, pt with a c/o heat exposure, syncope, and inferior eyelid laceration. It was reported that the pt was leaning over to pick up some change, lost consciousness, and hit his orbit on asphalt. The pt has denied vision problems. No neck or back pain.

## 2018-07-21 ENCOUNTER — Emergency Department (HOSPITAL_COMMUNITY): Payer: Medicare HMO

## 2018-07-21 ENCOUNTER — Encounter (HOSPITAL_COMMUNITY): Payer: Self-pay

## 2018-07-21 ENCOUNTER — Emergency Department (HOSPITAL_COMMUNITY)
Admission: EM | Admit: 2018-07-21 | Discharge: 2018-07-21 | Disposition: A | Discharge status: Home / Self Care | Payer: Medicare HMO | Attending: Emergency Medicine | Admitting: Emergency Medicine

## 2018-07-21 DIAGNOSIS — W010XXA Fall on same level from slipping, tripping and stumbling without subsequent striking against object, initial encounter: Secondary | ICD-10-CM | POA: Insufficient documentation

## 2018-07-21 DIAGNOSIS — F17210 Nicotine dependence, cigarettes, uncomplicated: Secondary | ICD-10-CM | POA: Insufficient documentation

## 2018-07-21 DIAGNOSIS — S81012A Laceration without foreign body, left knee, initial encounter: Secondary | ICD-10-CM | POA: Diagnosis not present

## 2018-07-21 DIAGNOSIS — M25562 Pain in left knee: Secondary | ICD-10-CM | POA: Diagnosis not present

## 2018-07-21 NOTE — ED Triage Notes (Signed)
Patient he was walking to the house and slipped falling on his LEFT knee. Reports drinking (2) 40 oz beers tonight. Denies hitting head or LOC. Patient's only complaint is LEFT knee pain.

## 2018-07-21 NOTE — ED Provider Notes (Signed)
[facility] Provider [facility]   CSN: [id number] Arrival date & time: 07/21/18  [id number]     History   Chief Complaint Chief Complaint  Patient presents with  . Laceration    HPI Steven Arnold is a 72 y.o. male.  Patient presents to the emergency department with a chief complaint of left knee pain.  He states that he slipped and fell outside of his house tonight on some leaves.  Landed on his left knee.  He denies any other injuries.  Last tetanus shot was about 2 or 3 months ago.  He has been ambulatory since the fall, but reports increased pain with walking.  Dates that he had been drinking tonight.  The history is provided by the patient. No language interpreter was used.    History reviewed. No pertinent past medical history.  There are no active problems to display for this patient.   History reviewed. No pertinent surgical history.      Home Medications    Prior to Admission medications   Medication Sig Start Date End Date Taking? Authorizing Provider  cephALEXin (KEFLEX) 500 MG capsule Take 1 capsule (500 mg total) by mouth 4 (four) times daily. 08/01/12   Nelva NayBeaton, Shawan Tosh, MD    Family History No family history on file.  Social History Social History   Tobacco Use  . Smoking status: Heavy Tobacco Smoker    Packs/day: 0.50  . Smokeless tobacco: Never Used  Substance Use Topics  . Alcohol use: Yes    Alcohol/week: 6.0 standard drinks    Types: 6 Cans of beer per week  . Drug use: No     Allergies   Patient has no known allergies.   Review of Systems Review of Systems  All other systems reviewed and are negative.    Physical Exam Updated Vital Signs BP (!) 141/88 (BP Location: Right Arm)   Pulse 95   Temp 97.7 F (36.5 C) (Oral)   Resp 17   Ht 5\' 6"  (1.676 m)   Wt 71.2 kg   SpO2 99%   BMI 25.34 kg/m   Physical Exam Vitals signs and nursing note reviewed.  Constitutional:      Appearance: He is  well-developed.  HENT:     Head: Normocephalic and atraumatic.  Eyes:     Conjunctiva/sclera: Conjunctivae normal.  Neck:     Musculoskeletal: Normal range of motion.  Cardiovascular:     Rate and Rhythm: Normal rate.  Pulmonary:     Effort: Pulmonary effort is normal.  Abdominal:     General: There is no distension.  Musculoskeletal: Normal range of motion.     Comments: Bony abnormality or deformity about the left knee, range of motion and strength 5/5  Skin:    General: Skin is dry.     Comments: Minor abrasion to anterior left knee, no laceration requiring repair  Neurological:     Mental Status: He is alert and oriented to person, place, and time.  Psychiatric:        Behavior: Behavior normal.        Thought Content: Thought content normal.        Judgment: Judgment normal.      ED Treatments / Results  Labs (all labs ordered are listed, but only abnormal results are displayed) Labs Reviewed - No data to display  EKG None  Radiology Dg Knee 2 Views Left  Result Date: 07/21/2018 CLINICAL DATA:  Fall with knee laceration  EXAM: LEFT KNEE - 1-2 VIEW COMPARISON:  None. FINDINGS: No fracture or dislocation. Mild joint space narrowing of the medial femorotibial space. No knee effusion. Incidentally noted laterally projecting exostosis of the distal femur. IMPRESSION: No acute osseous injury of the left knee. Electronically Signed   By: Deatra RobinsonKevin  Herman M.D.   On: 07/21/2018 05:13    Procedures Procedures (including critical care time)  Medications Ordered in ED Medications - No data to display   Initial Impression / Assessment and Plan / ED Course  I have reviewed the triage vital signs and the nursing notes.  Pertinent labs & imaging results that were available during my care of the patient were reviewed by me and considered in my medical decision making (see chart for details).     Patient with mechanical fall tonight, reports slipping on a leaf, likely also  aggravated by some alcohol use.  He has a small abrasion on his left knee.  Check x-ray to rule out any potential fracture.  He has been ambulatory.  No signs of any other injuries.  Anticipate discharge home. Final Clinical Impressions(s) / ED Diagnoses   Final diagnoses:  Acute pain of left knee    ED Discharge Orders    None       Roxy HorsemanBrowning, Yeudiel Mateo, PA-C 07/21/18 0542    Glynn Octaveancour, Stephen, MD 07/21/18 [phone]

## 2018-07-21 NOTE — ED Notes (Signed)
Patient verbalizes understanding of medications and discharge instructions. No further questions at this time. VSS and patient ambulatory at discharge.   

## 2018-08-22 ENCOUNTER — Encounter: Payer: Self-pay | Admitting: Pediatric Intensive Care

## 2018-08-23 NOTE — Congregational Nurse Program (Signed)
  Dept: [phone]   Congregational Nurse Program Note  Date of Encounter: 08/22/2018  Past Medical History: No past medical history on file.  Encounter Details: New client encounter. Client states that he has been having problems with urine "flow getting slow" and dribbling after voiding. Denies blood in urine, pain with voiding or urinary frequency. Client does not have a PCP but would like a referral. Client also states he needs assistance with getting a new phone as he lost his. CN referred client to [facility]- check in with an intern tomorrow to assist with phone. Client will return Wednesday to discuss PCP referral. Shann Medal RN BSN CNP [phone]

## 2019-03-27 ENCOUNTER — Emergency Department (HOSPITAL_COMMUNITY): Payer: Medicare HMO

## 2019-03-27 ENCOUNTER — Emergency Department (HOSPITAL_COMMUNITY)
Admission: EM | Admit: 2019-03-27 | Discharge: 2019-03-27 | Disposition: A | Discharge status: Home / Self Care | Payer: Medicare HMO | Attending: Emergency Medicine | Admitting: Emergency Medicine

## 2019-03-27 ENCOUNTER — Other Ambulatory Visit: Payer: Self-pay

## 2019-03-27 DIAGNOSIS — S199XXA Unspecified injury of neck, initial encounter: Secondary | ICD-10-CM | POA: Diagnosis not present

## 2019-03-27 DIAGNOSIS — F10920 Alcohol use, unspecified with intoxication, uncomplicated: Secondary | ICD-10-CM | POA: Diagnosis not present

## 2019-03-27 DIAGNOSIS — Z23 Encounter for immunization: Secondary | ICD-10-CM | POA: Insufficient documentation

## 2019-03-27 DIAGNOSIS — S0083XA Contusion of other part of head, initial encounter: Secondary | ICD-10-CM | POA: Diagnosis not present

## 2019-03-27 DIAGNOSIS — R260 Ataxic gait: Secondary | ICD-10-CM | POA: Diagnosis present

## 2019-03-27 DIAGNOSIS — Y929 Unspecified place or not applicable: Secondary | ICD-10-CM | POA: Insufficient documentation

## 2019-03-27 DIAGNOSIS — S0003XA Contusion of scalp, initial encounter: Secondary | ICD-10-CM | POA: Diagnosis not present

## 2019-03-27 DIAGNOSIS — S00212A Abrasion of left eyelid and periocular area, initial encounter: Secondary | ICD-10-CM | POA: Diagnosis not present

## 2019-03-27 DIAGNOSIS — Y939 Activity, unspecified: Secondary | ICD-10-CM | POA: Insufficient documentation

## 2019-03-27 DIAGNOSIS — W19XXXA Unspecified fall, initial encounter: Secondary | ICD-10-CM | POA: Diagnosis not present

## 2019-03-27 DIAGNOSIS — Y908 Blood alcohol level of 240 mg/100 ml or more: Secondary | ICD-10-CM | POA: Diagnosis not present

## 2019-03-27 DIAGNOSIS — F10120 Alcohol abuse with intoxication, uncomplicated: Secondary | ICD-10-CM | POA: Diagnosis not present

## 2019-03-27 DIAGNOSIS — Y999 Unspecified external cause status: Secondary | ICD-10-CM | POA: Diagnosis not present

## 2019-03-27 DIAGNOSIS — S0990XA Unspecified injury of head, initial encounter: Secondary | ICD-10-CM | POA: Diagnosis not present

## 2019-03-27 DIAGNOSIS — F17210 Nicotine dependence, cigarettes, uncomplicated: Secondary | ICD-10-CM | POA: Diagnosis not present

## 2019-03-27 DIAGNOSIS — R58 Hemorrhage, not elsewhere classified: Secondary | ICD-10-CM | POA: Diagnosis not present

## 2019-03-27 LAB — COMPREHENSIVE METABOLIC PANEL
ALT: 65 U/L — ABNORMAL HIGH (ref 0–44)
AST: 77 U/L — ABNORMAL HIGH (ref 15–41)
Albumin: 3.7 g/dL (ref 3.5–5.0)
Alkaline Phosphatase: 51 U/L (ref 38–126)
Anion gap: 12 (ref 5–15)
BUN: 9 mg/dL (ref 8–23)
CO2: 20 mmol/L — ABNORMAL LOW (ref 22–32)
Calcium: 8.9 mg/dL (ref 8.9–10.3)
Chloride: 102 mmol/L (ref 98–111)
Creatinine, Ser: 1 mg/dL (ref 0.61–1.24)
GFR calc Af Amer: >60 mL/min (ref >60)
GFR calc non Af Amer: >60 mL/min (ref >60)
Glucose, Bld: 92 mg/dL (ref 70–99)
Potassium: 3.3 mmol/L — ABNORMAL LOW (ref 3.5–5.1)
Sodium: 134 mmol/L — ABNORMAL LOW (ref 135–145)
Total Bilirubin: 0.7 mg/dL (ref 0.3–1.2)
Total Protein: 7.5 g/dL (ref 6.5–8.1)

## 2019-03-27 LAB — ETHANOL: Alcohol, Ethyl (B): 453 mg/dL (ref <10)

## 2019-03-27 LAB — CBC WITH DIFFERENTIAL/PLATELET
Abs Immature Granulocytes: 0 10*3/uL (ref 0.00–0.07)
Basophils Absolute: 0.1 10*3/uL (ref 0.0–0.1)
Basophils Relative: 4 %
Eosinophils Absolute: 0.3 10*3/uL (ref 0.0–0.5)
Eosinophils Relative: 10 %
HCT: 29.9 % — ABNORMAL LOW (ref 39.0–52.0)
Hemoglobin: 10 g/dL — ABNORMAL LOW (ref 13.0–17.0)
Lymphocytes Relative: 57 %
Lymphs Abs: 1.4 10*3/uL (ref 0.7–4.0)
MCH: 34.5 pg — ABNORMAL HIGH (ref 26.0–34.0)
MCHC: 33.4 g/dL (ref 30.0–36.0)
MCV: 103.1 fL — ABNORMAL HIGH (ref 80.0–100.0)
Monocytes Absolute: 0.2 10*3/uL (ref 0.1–1.0)
Monocytes Relative: 6 %
Neutro Abs: 0.6 10*3/uL — ABNORMAL LOW (ref 1.7–7.7)
Neutrophils Relative %: 23 %
Platelets: 199 10*3/uL (ref 150–400)
RBC: 2.9 MIL/uL — ABNORMAL LOW (ref 4.22–5.81)
RDW: 12.2 % (ref 11.5–15.5)
WBC: 2.5 10*3/uL — ABNORMAL LOW (ref 4.0–10.5)
nRBC: 0 % (ref 0.0–0.2)
nRBC: 3 /100 WBC — ABNORMAL HIGH

## 2019-03-27 MED ORDER — TETANUS-DIPHTH-ACELL PERTUSSIS 5-2.5-18.5 LF-MCG/0.5 IM SUSP
0.5000 mL | Freq: Once | INTRAMUSCULAR | Status: AC
  Administered 2019-03-27: 0.5 mL via INTRAMUSCULAR
  Filled 2019-03-27: qty 0.5

## 2019-03-27 NOTE — ED Provider Notes (Signed)
Care assumed from [facility], [place], at shift change, please see their notes for full documentation of patient's complaint/HPI. Briefly, pt here with fall/alcohol intoxication. Results so far show elevated EtOH at 453, potassium 3.3, mild elevations in AST/ALT, stable HGB at 10.0. Awaiting pt to sober up/CT head and CT C spine - it appears patient has been uncooperative for images. Plan is to discharge if images unremarkable.    Physical Exam  BP 101/64   Pulse 62   Temp (!) 97.2 F (36.2 C) (Oral)   Resp 18   SpO2 96%   Physical Exam  ED Course/Procedures     Procedures  MDM  CT head and C-spine negative at this time.  Patient to sober up in the ED prior to discharge.  Will ambulate patient to assess stability prior to discharge.   Nursing staff attempted to ambulate patient but he was unsteady on his feet.  Have given him something to eat and drink.  Will have patient sleep for another few hours and reassess.  3:48 PM At shift change case signed out to Irena Cords, PA-C, who will reassess patient.          Eustaquio Maize, PA-C 03/27/19 Adrian, [place], DO 03/29/19 1505

## 2019-03-27 NOTE — ED Provider Notes (Signed)
[facility] Provider Note   CSN: [id number] Arrival date & time: 03/27/19  0344     History   Chief Complaint Chief Complaint  Patient presents with  . Fall    HPI Steven Arnold is a 73 y.o. male.     Patient to ED by EMS after a fall witnessed by bystander. No LOC. Patient got himself up and was walking on scene when EMS arrived. Patient appears to be significantly intoxicated, per EMS. No vomiting. Per EMS report the patient has a facial laceration above left eye. No other complaints.  The history is provided by the EMS personnel. No language interpreter was used.  Fall    No past medical history on file.  There are no active problems to display for this patient.   No past surgical history on file.      Home Medications    Prior to Admission medications   Medication Sig Start Date End Date Taking? Authorizing Provider  cephALEXin (KEFLEX) 500 MG capsule Take 1 capsule (500 mg total) by mouth 4 (four) times daily. 08/01/12   Nelva NayBeaton, Robert, MD    Family History No family history on file.  Social History Social History   Tobacco Use  . Smoking status: Heavy Tobacco Smoker    Packs/day: 0.50  . Smokeless tobacco: Never Used  Substance Use Topics  . Alcohol use: Yes    Alcohol/week: 6.0 standard drinks    Types: 6 Cans of beer per week  . Drug use: No     Allergies   Patient has no known allergies.   Review of Systems Review of Systems  Unable to perform ROS: Mental status change (Acute alcohol intoxication)  Skin: Positive for wound.     Physical Exam Updated Vital Signs BP (!) 144/79 (BP Location: Right Arm)   Pulse 84   Temp (!) 97.2 F (36.2 C) (Oral)   Resp 18   SpO2 98%   Physical Exam Vitals signs and nursing note reviewed.  Constitutional:      Comments: Awake, slurred speech, acutely intoxicated.  HENT:     Head:     Comments: 1.5 cm laceration above left eyebrow with associated  small hematoma. There is a superficial abrasion inferior left eye lid.  No bony deformities.     Nose: Nose normal.  Eyes:     Pupils: Pupils are equal, round, and reactive to light.     Comments: FROM of EO motion with limitation.  Cardiovascular:     Rate and Rhythm: Normal rate and regular rhythm.     Heart sounds: No murmur.  Pulmonary:     Effort: Pulmonary effort is normal.     Breath sounds: No wheezing, rhonchi or rales.  Chest:     Chest wall: No tenderness.  Abdominal:     Tenderness: There is no abdominal tenderness.  Musculoskeletal:     Comments: Moves all extremities. No deformities.   Skin:    General: Skin is warm and dry.      ED Treatments / Results  Labs (all labs ordered are listed, but only abnormal results are displayed) Labs Reviewed - No data to display  EKG None  Radiology No results found.  Procedures Procedures (including critical care time)  Medications Ordered in ED Medications  Tdap (BOOSTRIX) injection 0.5 mL (has no administration in time range)     Initial Impression / Assessment and Plan / ED Course  I have reviewed the triage  vital signs and the nursing notes.  Pertinent labs & imaging results that were available during my care of the patient were reviewed by me and considered in my medical decision making (see chart for details).        Patient to ED after witnessed fall and EMS was called. He is acutely intoxicated and cannot contribute to history. He is awake, alert, answers to his name being called. Moves all extremities.  He does cooperate with CT scanning which is felt necessary given his presentation. ETOH 452. Will allow him to sober up and re-attempt imaging.  Wounds to left forehead non-suturable.   Patient care signed out to [facility], PA-C, pending re-evaluation when imaging obtained and patient sober enough to be safe for discharge.     Final Clinical Impressions(s) / ED Diagnoses   Final diagnoses:   None   1. Fall 2. Facial abrasions 3. Alcohol intoxication  ED Discharge Orders    None       Charlann Lange, Hershal Coria 03/27/19 7915    Mesner, Corene Cornea, MD 03/30/19 [phone]

## 2019-03-27 NOTE — ED Triage Notes (Signed)
Pt arrives via [facility], bystander saw pt fall. ETOH. Pt was up and walking on EMS arrival. Laceration over the left eye brow and left eye. No LOC. C collar placed en route. VSS.

## 2019-03-27 NOTE — ED Notes (Signed)
Pt removed c collar. Replaced and reminded pt to keep on

## 2019-03-27 NOTE — ED Notes (Signed)
Pt eating dinner tray °

## 2019-03-27 NOTE — ED Notes (Addendum)
Walked patient around nurses station twice, patient still a little unstable on feer ; per PA feed patient again and watch him for a little longer ; pt alert and oriented x 4 at this time ;  Meal tray ordered for patient

## 2019-03-27 NOTE — ED Notes (Signed)
Provider made aware pt was uncooperative for radiology to complete scan and was brought back to department.

## 2019-03-27 NOTE — ED Notes (Signed)
Patient transported to CT 

## 2019-03-27 NOTE — Discharge Instructions (Addendum)
Return here as needed.  Follow-up with your primary doctor. °

## 2019-03-27 NOTE — ED Notes (Signed)
Pt got up, urinated all over the floor and his clothing. Pt clothing removed, new sheets and gowns placed. Pt resting at this time

## 2019-03-27 NOTE — ED Notes (Signed)
Pt alert and oriented x4  And walked around nurses station with strong and steady gait; denies any weakness or dizziness and states he is ready to go home

## 2019-03-27 NOTE — ED Notes (Signed)
Discharge instructions discussed with pt. Pt verbalized understanding. Pt stable and ambulatory. No signature pad available. 

## 2019-03-27 NOTE — ED Notes (Signed)
Pt keeps removing his monitoring equipment and c collar

## 2019-03-27 NOTE — ED Notes (Signed)
Ambulated patient to bathroom , pt unstable on feet. Pt alert and oriented x 2 ; walked patient back to bed , gave him some food and something to drink

## 2019-06-01 ENCOUNTER — Ambulatory Visit (HOSPITAL_BASED_OUTPATIENT_CLINIC_OR_DEPARTMENT_OTHER): Payer: Medicare HMO | Admitting: Pharmacist

## 2019-06-01 ENCOUNTER — Ambulatory Visit: Payer: Medicare HMO | Attending: Family Medicine | Admitting: Licensed Clinical Social Worker

## 2019-06-01 ENCOUNTER — Ambulatory Visit: Payer: Medicare HMO | Attending: Internal Medicine | Admitting: Internal Medicine

## 2019-06-01 ENCOUNTER — Encounter: Payer: Self-pay | Admitting: Internal Medicine

## 2019-06-01 ENCOUNTER — Other Ambulatory Visit: Payer: Self-pay

## 2019-06-01 VITALS — BP 200/100 | HR 75 | Temp 98.3°F | Resp 16 | Ht 66.0 in | Wt 134.0 lb

## 2019-06-01 DIAGNOSIS — Z5900 Homelessness unspecified: Secondary | ICD-10-CM

## 2019-06-01 DIAGNOSIS — F1020 Alcohol dependence, uncomplicated: Secondary | ICD-10-CM | POA: Insufficient documentation

## 2019-06-01 DIAGNOSIS — R945 Abnormal results of liver function studies: Secondary | ICD-10-CM | POA: Diagnosis not present

## 2019-06-01 DIAGNOSIS — R3581 Nocturnal polyuria: Secondary | ICD-10-CM

## 2019-06-01 DIAGNOSIS — Z23 Encounter for immunization: Secondary | ICD-10-CM

## 2019-06-01 DIAGNOSIS — D539 Nutritional anemia, unspecified: Secondary | ICD-10-CM | POA: Diagnosis not present

## 2019-06-01 DIAGNOSIS — Z114 Encounter for screening for human immunodeficiency virus [HIV]: Secondary | ICD-10-CM | POA: Diagnosis not present

## 2019-06-01 DIAGNOSIS — R351 Nocturia: Secondary | ICD-10-CM | POA: Diagnosis not present

## 2019-06-01 DIAGNOSIS — R7989 Other specified abnormal findings of blood chemistry: Secondary | ICD-10-CM | POA: Insufficient documentation

## 2019-06-01 DIAGNOSIS — I10 Essential (primary) hypertension: Secondary | ICD-10-CM | POA: Insufficient documentation

## 2019-06-01 DIAGNOSIS — E871 Hypo-osmolality and hyponatremia: Secondary | ICD-10-CM

## 2019-06-01 DIAGNOSIS — F17220 Nicotine dependence, chewing tobacco, uncomplicated: Secondary | ICD-10-CM | POA: Diagnosis not present

## 2019-06-01 DIAGNOSIS — F17200 Nicotine dependence, unspecified, uncomplicated: Secondary | ICD-10-CM | POA: Insufficient documentation

## 2019-06-01 MED ORDER — AMLODIPINE BESYLATE 5 MG PO TABS: 5.0000 mg | ORAL_TABLET | Freq: Every day | ORAL | 3 refills | Status: DC

## 2019-06-01 MED ORDER — VITAMIN B-1 100 MG PO TABS: 100.0000 mg | ORAL_TABLET | Freq: Every day | ORAL | 1 refills | Status: DC

## 2019-06-01 MED ORDER — FOLIC ACID 1 MG PO TABS: 1.0000 mg | ORAL_TABLET | Freq: Every day | ORAL | 1 refills | Status: DC

## 2019-06-01 MED ORDER — TAMSULOSIN HCL 0.4 MG PO CAPS: 0.4000 mg | ORAL_CAPSULE | Freq: Every day | ORAL | 3 refills | Status: DC

## 2019-06-01 MED ORDER — NICOTINE 21 MG/24HR TD PT24: 21.0000 mg | MEDICATED_PATCH | Freq: Every day | TRANSDERMAL | 0 refills | Status: DC

## 2019-06-01 NOTE — BH Specialist Note (Signed)
Integrated Behavioral Health Initial Visit  MRN: [id number] Name: Steven Arnold  Number of [place] Clinician visits:: 1/6 Session Start time: 3:05pm  Session End time: 4:30pm Total time: 25 min  Type of Service: [place] Interpretor:No. Interpretor Name and Language: N/A   Warm Hand Off Completed.       SUBJECTIVE: Steven Arnold is a 73 y.o. male accompanied by self Patient was referred by Dr. Wynetta Emery for Alcohol Use Disorder and Homelessness. Patient reports the following symptoms/concerns: Pt reports homelessness and interest in tobacco cessation. Duration of problem: Ongoing; Severity of problem: moderate  OBJECTIVE: Mood: Pleasant and Affect: Appropriate Risk of harm to self or others: No plan to harm self or others  LIFE CONTEXT: Family and Social: Pt is currently experiencing homelessness and lives outside.  School/Work: Pt is unemployed. He is insured via Medicaid and Medicare. 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 Pt does not have a working phone. Uses phone at [facility].  Life Changes: Pt entered homelessness 3 months ago  GOALS ADDRESSED: Patient will: 1. Reduce symptoms of: stress 2. Increase knowledge and/or ability of: healthy habits  3. Demonstrate ability to: Increase adequate support systems for patient/family and Decrease self-medicating behaviors  INTERVENTIONS: Interventions utilized: Supportive Counseling and Psychoeducation and/or Health Education  Standardized Assessments completed: None  ASSESSMENT: Patient currently experiencing homelessness for the past three months. Pt is satisfied with the support he is receiving at [employer]. Behavioral Health Intern introduced role at clinic and explained integrated care model at [facility]. BHI inquired about pt's alcohol use; pt states he drinks 1 40 oz beer or 16oz of beer per day. Pt is not  interested in stopping use at this time. Pt states he quit 2 years ago for 6 months and believes he can quit again successfully if he chooses to. Pt is interested in tobacco cessation. Pt reports he feels confident that he can quit with patches prescribed during today's encounter. BHI provided pt with contact information to LCSW and BHI if pt feels he needs additional support and encouraged pt to use [facility] pharmacy if he has difficulty obtaining his medication from today's encounter. Pt declined additional assistance regarding housing, substance use, and food.    Patient may benefit from continued case management at [facility] and tobacco cessation. BHI commended pt for his desire to quit tobacco and encouraged him to use patches prescribed during today's encounter with his provider.  PLAN: 1. Follow up with behavioral health clinician on : Pt encouraged to contact LCSW or BHI if he feels he is in need of additional support. 2. Behavioral recommendations: Follow through with tobacco cessation and continue case management services with [facility].  3. Referral(s): Community Resources:  Continue working with case workers at [employer] 4. "From scale of 1-10, how likely are you to follow plan?":   [place] Intern 06/01/2019 6:35PM

## 2019-06-01 NOTE — Patient Instructions (Addendum)
You have elevated blood pressure.  We have started you on a medication called amlodipine to help control your blood pressure.  Please cut back on salt in the foods.  We have started you on the nicotine patches to help you quit smoking.    Pneumococcal Conjugate Vaccine (PCV13): What You Need to Know 1. Why get vaccinated? Pneumococcal conjugate vaccine (PCV13) can prevent pneumococcal disease. Pneumococcal disease refers to any illness caused by pneumococcal bacteria. These bacteria can cause many types of illnesses, including pneumonia, which is an infection of the lungs. Pneumococcal bacteria are one of the most common causes of pneumonia. Besides pneumonia, pneumococcal bacteria can also cause:  Ear infections  Sinus infections  Meningitis (infection of the tissue covering the brain and spinal cord)  Bacteremia (bloodstream infection) Anyone can get pneumococcal disease, but children under 30 years of age, people with certain medical conditions, adults 30 years or older, and cigarette smokers are at the highest risk. Most pneumococcal infections are mild. However, some can result in long-term problems, such as brain damage or hearing loss. Meningitis, bacteremia, and pneumonia caused by pneumococcal disease can be fatal. 2. PCV13 PCV13 protects against 13 types of bacteria that cause pneumococcal disease. Infants and young children usually need 4 doses of pneumococcal conjugate vaccine, at 2, 4, 6, and 36-11 months of age. In some cases, a child might need fewer than 4 doses to complete PCV13 vaccination. A dose of PCV23 vaccine is also recommended for anyone 2 years or older with certain medical conditions if they did not already receive PCV13. This vaccine may be given to adults 49 years or older based on discussions between the patient and health care provider. 3. Talk with your health care provider Tell your vaccine provider if the person getting the vaccine:  Has had an allergic  reaction after a previous dose of PCV13, to an earlier pneumococcal conjugate vaccine known as PCV7, or to any vaccine containing diphtheria toxoid (for example, DTaP), or has any severe, life-threatening allergies.  In some cases, your health care provider may decide to postpone PCV13 vaccination to a future visit. People with minor illnesses, such as a cold, may be vaccinated. People who are moderately or severely ill should usually wait until they recover before getting PCV13. Your health care provider can give you more information. 4. Risks of a vaccine reaction  Redness, swelling, pain, or tenderness where the shot is given, and fever, loss of appetite, fussiness (irritability), feeling tired, headache, and chills can happen after PCV13. Young children may be at increased risk for seizures caused by fever after PCV13 if it is administered at the same time as inactivated influenza vaccine. Ask your health care provider for more information. People sometimes faint after medical procedures, including vaccination. Tell your provider if you feel dizzy or have vision changes or ringing in the ears. As with any medicine, there is a very remote chance of a vaccine causing a severe allergic reaction, other serious injury, or death. 5. What if there is a serious problem? An allergic reaction could occur after the vaccinated person leaves the clinic. If you see signs of a severe allergic reaction (hives, swelling of the face and throat, difficulty breathing, a fast heartbeat, dizziness, or weakness), call 9-1-1 and get the person to the nearest hospital. For other signs that concern you, call your health care provider. Adverse reactions should be reported to the Vaccine Adverse Event Reporting System (VAERS). Your health care provider will usually file this report,  or you can do it yourself. Visit the VAERS website at www.vaers.LAgents.no or call 239-301-8788. VAERS is only for reporting reactions, and VAERS  staff do not give medical advice. 6. The National Vaccine Injury Compensation Program The [employer] Vaccine Injury Compensation Program (VICP) is a federal program that was created to compensate people who may have been injured by certain vaccines. Visit the VICP website at SpiritualWord.at or call 714-387-3675 to learn about the program and about filing a claim. There is a time limit to file a claim for compensation. 7. How can I learn more?  Ask your health care provider.  Call your local or state health department.  Contact the Centers for Disease Control and Prevention (CDC): ? Call 702-339-5005 (1-800-CDC-INFO) or ? Visit CDC's website at PicCapture.uy Vaccine Information Statement PCV13 Vaccine (06/01/2018) This information is not intended to replace advice given to you by your health care provider. Make sure you discuss any questions you have with your health care provider. Document Released: 05/17/2006 Document Revised: 11/08/2018 Document Reviewed: 03/01/2018 Elsevier Patient Education  2020 Elsevier Inc.    Influenza Virus Vaccine injection (Fluarix) What is this medicine? INFLUENZA VIRUS VACCINE (in floo EN zuh VAHY ruhs vak SEEN) helps to reduce the risk of getting influenza also known as the flu. This medicine may be used for other purposes; ask your health care provider or pharmacist if you have questions. COMMON BRAND NAME(S): Fluarix, Fluzone What should I tell my health care provider before I take this medicine? They need to know if you have any of these conditions:  bleeding disorder like hemophilia  fever or infection  Guillain-Barre syndrome or other neurological problems  immune system problems  infection with the human immunodeficiency virus (HIV) or AIDS  low blood platelet counts  multiple sclerosis  an unusual or allergic reaction to influenza virus vaccine, eggs, chicken proteins, latex, gentamicin, other medicines, foods,  dyes or preservatives  pregnant or trying to get pregnant  breast-feeding How should I use this medicine? This vaccine is for injection into a muscle. It is given by a health care professional. A copy of Vaccine Information Statements will be given before each vaccination. Read this sheet carefully each time. The sheet may change frequently. Talk to your pediatrician regarding the use of this medicine in children. Special care may be needed. Overdosage: If you think you have taken too much of this medicine contact a poison control center or emergency room at once. NOTE: This medicine is only for you. Do not share this medicine with others. What if I miss a dose? This does not apply. What may interact with this medicine?  chemotherapy or radiation therapy  medicines that lower your immune system like etanercept, anakinra, infliximab, and adalimumab  medicines that treat or prevent blood clots like warfarin  phenytoin  steroid medicines like prednisone or cortisone  theophylline  vaccines This list may not describe all possible interactions. Give your health care provider a list of all the medicines, herbs, non-prescription drugs, or dietary supplements you use. Also tell them if you smoke, drink alcohol, or use illegal drugs. Some items may interact with your medicine. What should I watch for while using this medicine? Report any side effects that do not go away within 3 days to your doctor or health care professional. Call your health care provider if any unusual symptoms occur within 6 weeks of receiving this vaccine. You may still catch the flu, but the illness is not usually as bad. You cannot get  the flu from the vaccine. The vaccine will not protect against colds or other illnesses that may cause fever. The vaccine is needed every year. What side effects may I notice from receiving this medicine? Side effects that you should report to your doctor or health care professional as  soon as possible:  allergic reactions like skin rash, itching or hives, swelling of the face, lips, or tongue Side effects that usually do not require medical attention (report to your doctor or health care professional if they continue or are bothersome):  fever  headache  muscle aches and pains  pain, tenderness, redness, or swelling at site where injected  weak or tired This list may not describe all possible side effects. Call your doctor for medical advice about side effects. You may report side effects to FDA at 1-800-FDA-1088. Where should I keep my medicine? This vaccine is only given in a clinic, pharmacy, doctor's office, or other health care setting and will not be stored at home. NOTE: This sheet is a summary. It may not cover all possible information. If you have questions about this medicine, talk to your doctor, pharmacist, or health care provider.  2020 Elsevier/Gold Standard (2008-02-15 09:30:40)

## 2019-06-01 NOTE — Progress Notes (Signed)
Patient ID: Steven LarkLindsay R Arnold, male    DOB: 12/26/1945  MRN: [id number]  CC: New Patient (Initial Visit)  Visit started: 2:05 p.m  Subjective: Steven Arnold is a 73 y.o. male who presents for new pt visit His concerns today include:   No previous PCP No previous chronic med issues Taking an OTC med called Prostate Max Plus.  Decided to take this because of freq urination at nights x 1 yr.  Goes 4-5 x at nights -feels he incompletely voids -endorses weak stream with stop and go flow.  -no dysuria  Drinks two 40 oz beers a day if he has the money.  Tells me he use to drink hard liquor and wine.  He feels he can quit.  Had quit one time for 6 mths.  Fell back in August while intoxicated.  He was seen in the ER.  He has not had any falls since then.  I reviewed the ER note and labs that were done.  He has a macrocytic anemia, abnormal LFTs and hyponatremia.  His alcohol level on that visit was greater than 400  Tob dep: smokes 1/2 a pk a day.  Smoked for over 50 yrs Quit for 2 mths 45 yrs ago.   Feels he can and should quit "because I'm getting old."   Currently homeless x 3 mths.  Prior to this he was living with a man friend.  Told to leave because his GF who was living with them was drinking too much so friend put her out and he left with her.  He is now living on the street.  He states he is working with the caseworker trying to find permanent housing.   Past medical, social, family history and surgical histories reviewed and updated. No current outpatient medications on file prior to visit.   No current facility-administered medications on file prior to visit.     No Known Allergies  Social History   Socioeconomic History  . Marital status: Widowed    Spouse name: Not on file  . Number of children: 1  . Years of education: 5810  . Highest education level: Not on file  Occupational History  . Not on file  Social Needs  . Financial resource strain: Not on file  . Food  insecurity    Worry: Not on file    Inability: Not on file  . Transportation needs    Medical: Not on file    Non-medical: Not on file  Tobacco Use  . Smoking status: Heavy Tobacco Smoker    Packs/day: 0.50  . Smokeless tobacco: Current User    Types: Snuff  Substance and Sexual Activity  . Alcohol use: Yes    Alcohol/week: 6.0 standard drinks    Types: 6 Cans of beer per week  . Drug use: No  . Sexual activity: Not on file  Lifestyle  . Physical activity    Days per week: Not on file    Minutes per session: Not on file  . Stress: Not on file  Relationships  . Social Musicianconnections    Talks on phone: Not on file    Gets together: Not on file    Attends religious service: Not on file    Active member of club or organization: Not on file    Attends meetings of clubs or organizations: Not on file    Relationship status: Not on file  . Intimate partner violence    Fear of current or ex  partner: Not on file    Emotionally abused: Not on file    Physically abused: Not on file    Forced sexual activity: Not on file  Other Topics Concern  . Not on file  Social History Narrative  . Not on file    History reviewed. No pertinent family history.  Past Surgical History:  Procedure Laterality Date  . NO PAST SURGERIES      ROS: Review of Systems Negative except as stated above  PHYSICAL EXAM: BP (!) 200/100   Pulse 75   Temp 98.3 F (36.8 C) (Oral)   Resp 16   Ht 5\' 6"  (1.676 m)   Wt 134 lb (60.8 kg)   SpO2 96%   BMI 21.63 kg/m   Physical Exam  General appearance - alert, somewhat disheveled pleasant elderly African-American male, and in no distress Mental status -flat affect.  He answers questions appropriately Eyes - pupils equal and reactive, extraocular eye movements intact Nose - normal and patent, no erythema, discharge or polyps Mouth -he is edentulous above Neck - supple, no significant adenopathy Chest - clear to auscultation, no wheezes, rales or  rhonchi, symmetric air entry Heart - normal rate, regular rhythm, normal S1, S2, no murmurs, rubs, clicks or gallops Abdomen - soft, nontender, nondistended, no masses or organomegaly Musculoskeletal -gait is steady with good pace and good foot to floor clearance. Extremities - peripheral pulses normal, no pedal edema, no clubbing or cyanosis  CMP Latest Ref Rng & Units 03/27/2019 04/10/2018  Glucose 70 - 99 mg/dL 92 113(H)  BUN 8 - 23 mg/dL 9 16  Creatinine 0.61 - 1.24 mg/dL 1.00 1.21  Sodium 135 - 145 mmol/L 134(L) 134(L)  Potassium 3.5 - 5.1 mmol/L 3.3(L) 3.5  Chloride 98 - 111 mmol/L 102 99  CO2 22 - 32 mmol/L 20(L) 21(L)  Calcium 8.9 - 10.3 mg/dL 8.9 8.8(L)  Total Protein 6.5 - 8.1 g/dL 7.5 -  Total Bilirubin 0.3 - 1.2 mg/dL 0.7 -  Alkaline Phos 38 - 126 U/L 51 -  AST 15 - 41 U/L 77(H) -  ALT 0 - 44 U/L 65(H) -   Lipid Panel  No results found for: CHOL, TRIG, HDL, CHOLHDL, VLDL, LDLCALC, LDLDIRECT  CBC    Component Value Date/Time   WBC 2.5 (L) 03/27/2019 0449   RBC 2.90 (L) 03/27/2019 0449   HGB 10.0 (L) 03/27/2019 0449   HCT 29.9 (L) 03/27/2019 0449   PLT 199 03/27/2019 0449   MCV 103.1 (H) 03/27/2019 0449   MCH 34.5 (H) 03/27/2019 0449   MCHC 33.4 03/27/2019 0449   RDW 12.2 03/27/2019 0449   LYMPHSABS 1.4 03/27/2019 0449   MONOABS 0.2 03/27/2019 0449   EOSABS 0.3 03/27/2019 0449   BASOSABS 0.1 03/27/2019 0449    ASSESSMENT AND PLAN: 1. Nocturnal polyuria Likely BPH. Start Flomax - PSA - Urinalysis  2. Essential hypertension Elevated today and noted to be elevated in the system on other encounters in the health system.  I recommend that we start amlodipine.  DASH diet discussed and encouraged. - CBC With Differential - Comprehensive metabolic panel  3. Alcohol use disorder, moderate, dependence ([place]) Strongly advised to quit.  Discussed health risks associated with excessive alcohol use.  LCSW to see him today  4. Macrocytic anemia We will check V77 and  folic acid level.  Start him on thiamine and folate. - Vitamin B12 - Folate  5. Abnormal LFTs Likely related to EtOH use. - Hepatitis C Antibody  6.  Hyponatremia Likely related to EtOH  7. Need for influenza vaccination Given  8. Need for vaccination against Streptococcus pneumoniae using pneumococcal conjugate vaccine 13 Given  9. Encounter for screening for HIV - HIV Antibody (routine testing w rflx)  10. Homeless Patient tells me that he is currently working with a case worker to search for permanent housing.  11.  Tobacco dependence.  Discussed health risks associated with smoking.  Advised to quit.  Patient wanting unwilling to give a trial of quitting.  He is willing to try the nicotine patches.  Prescription sent to his pharmacy.  Less than 5 minutes spent on counseling.  Patient was given the opportunity to ask questions.  Patient verbalized understanding of the plan and was able to repeat key elements of the plan.   Orders Placed This Encounter  Procedures  . PSA  . Urinalysis  . Vitamin B12  . Folate  . CBC With Differential  . Comprehensive metabolic panel  . Hepatitis C Antibody  . HIV Antibody (routine testing w rflx)     Requested Prescriptions   Signed Prescriptions Disp Refills  . amLODipine (NORVASC) 5 MG tablet 90 tablet 3    Sig: Take 1 tablet (5 mg total) by mouth daily.  . tamsulosin (FLOMAX) 0.4 MG CAPS capsule 90 capsule 3    Sig: Take 1 capsule (0.4 mg total) by mouth daily.  Marland Kitchen thiamine (VITAMIN B-1) 100 MG tablet 100 tablet 1    Sig: Take 1 tablet (100 mg total) by mouth daily.  . folic acid (FOLVITE) 1 MG tablet 100 tablet 1    Sig: Take 1 tablet (1 mg total) by mouth daily.  . nicotine (NICODERM CQ - DOSED IN MG/24 HOURS) 21 mg/24hr patch 28 patch 0    Sig: Place 1 patch (21 mg total) onto the skin daily.    Return in about 4 weeks (around 06/29/2019).  Jonah Blue, MD, FACP

## 2019-06-01 NOTE — Progress Notes (Signed)
Patient presents for vaccination against influenza and strep pneumo per orders of Dr. Johnson. Consent given. Counseling provided. No contraindications exists. Vaccine administered without incident.   

## 2019-06-02 LAB — CBC WITH DIFFERENTIAL
Basophils Absolute: 0.1 10*3/uL (ref 0.0–0.2)
Basos: 1 %
EOS (ABSOLUTE): 0.7 10*3/uL — ABNORMAL HIGH (ref 0.0–0.4)
Eos: 15 %
Hematocrit: 31.7 % — ABNORMAL LOW (ref 37.5–51.0)
Hemoglobin: 11 g/dL — ABNORMAL LOW (ref 13.0–17.7)
Immature Grans (Abs): 0 10*3/uL (ref 0.0–0.1)
Immature Granulocytes: 0 %
Lymphocytes Absolute: 1.9 10*3/uL (ref 0.7–3.1)
Lymphs: 39 %
MCH: 34 pg — ABNORMAL HIGH (ref 26.6–33.0)
MCHC: 34.7 g/dL (ref 31.5–35.7)
MCV: 98 fL — ABNORMAL HIGH (ref 79–97)
Monocytes Absolute: 0.5 10*3/uL (ref 0.1–0.9)
Monocytes: 10 %
Neutrophils Absolute: 1.7 10*3/uL (ref 1.4–7.0)
Neutrophils: 35 %
RBC: 3.24 x10E6/uL — ABNORMAL LOW (ref 4.14–5.80)
RDW: 11.8 % (ref 11.6–15.4)
WBC: 4.8 10*3/uL (ref 3.4–10.8)

## 2019-06-02 LAB — COMPREHENSIVE METABOLIC PANEL
ALT: 12 IU/L (ref 0–44)
AST: 24 IU/L (ref 0–40)
Albumin/Globulin Ratio: 1.1 — ABNORMAL LOW (ref 1.2–2.2)
Albumin: 4.7 g/dL (ref 3.7–4.7)
Alkaline Phosphatase: 63 IU/L (ref 39–117)
BUN/Creatinine Ratio: 13 (ref 10–24)
BUN: 11 mg/dL (ref 8–27)
Bilirubin Total: 0.4 mg/dL (ref 0.0–1.2)
CO2: 22 mmol/L (ref 20–29)
Calcium: 9.9 mg/dL (ref 8.6–10.2)
Chloride: 101 mmol/L (ref 96–106)
Creatinine, Ser: 0.88 mg/dL (ref 0.76–1.27)
GFR calc Af Amer: 100 mL/min/{1.73_m2} (ref >59)
GFR calc non Af Amer: 86 mL/min/{1.73_m2} (ref >59)
Globulin, Total: 4.1 g/dL (ref 1.5–4.5)
Glucose: 86 mg/dL (ref 65–99)
Potassium: 3.5 mmol/L (ref 3.5–5.2)
Sodium: 138 mmol/L (ref 134–144)
Total Protein: 8.8 g/dL — ABNORMAL HIGH (ref 6.0–8.5)

## 2019-06-02 LAB — HEPATITIS C ANTIBODY: Hep C Virus Ab: 0.2 s/co ratio (ref 0.0–0.9)

## 2019-06-02 LAB — PSA: Prostate Specific Ag, Serum: 12.2 ng/mL — ABNORMAL HIGH (ref 0.0–4.0)

## 2019-06-02 LAB — VITAMIN B12: Vitamin B-12: 702 pg/mL (ref 232–1245)

## 2019-06-02 LAB — HIV ANTIBODY (ROUTINE TESTING W REFLEX): HIV Screen 4th Generation wRfx: NONREACTIVE

## 2019-06-02 LAB — FOLATE: Folate: 11.9 ng/mL (ref >3.0)

## 2019-06-03 ENCOUNTER — Encounter: Payer: Self-pay | Admitting: Internal Medicine

## 2019-06-03 ENCOUNTER — Other Ambulatory Visit: Payer: Self-pay | Admitting: Internal Medicine

## 2019-06-03 DIAGNOSIS — R972 Elevated prostate specific antigen [PSA]: Secondary | ICD-10-CM | POA: Insufficient documentation

## 2019-06-03 DIAGNOSIS — Z1211 Encounter for screening for malignant neoplasm of colon: Secondary | ICD-10-CM

## 2019-06-06 ENCOUNTER — Telehealth: Payer: Self-pay

## 2019-06-06 NOTE — Telephone Encounter (Signed)
Contacted pt to go over lab results pt didn't answer and was unable to lvm  

## 2019-07-06 ENCOUNTER — Encounter: Payer: Self-pay | Admitting: Internal Medicine

## 2019-07-07 ENCOUNTER — Ambulatory Visit: Payer: Medicare HMO | Attending: Internal Medicine | Admitting: Internal Medicine

## 2019-07-07 ENCOUNTER — Other Ambulatory Visit: Payer: Self-pay

## 2019-08-03 ENCOUNTER — Emergency Department (HOSPITAL_COMMUNITY)
Admission: EM | Admit: 2019-08-03 | Discharge: 2019-08-04 | Disposition: A | Discharge status: Home / Self Care | Payer: Medicare HMO | Attending: Emergency Medicine | Admitting: Emergency Medicine

## 2019-08-03 ENCOUNTER — Emergency Department (HOSPITAL_COMMUNITY): Payer: Medicare HMO

## 2019-08-03 ENCOUNTER — Encounter (HOSPITAL_COMMUNITY): Payer: Self-pay

## 2019-08-03 ENCOUNTER — Other Ambulatory Visit: Payer: Self-pay

## 2019-08-03 DIAGNOSIS — F10129 Alcohol abuse with intoxication, unspecified: Secondary | ICD-10-CM | POA: Diagnosis not present

## 2019-08-03 DIAGNOSIS — F10929 Alcohol use, unspecified with intoxication, unspecified: Secondary | ICD-10-CM | POA: Insufficient documentation

## 2019-08-03 DIAGNOSIS — R58 Hemorrhage, not elsewhere classified: Secondary | ICD-10-CM | POA: Diagnosis not present

## 2019-08-03 DIAGNOSIS — F17210 Nicotine dependence, cigarettes, uncomplicated: Secondary | ICD-10-CM | POA: Diagnosis not present

## 2019-08-03 DIAGNOSIS — F17220 Nicotine dependence, chewing tobacco, uncomplicated: Secondary | ICD-10-CM | POA: Insufficient documentation

## 2019-08-03 DIAGNOSIS — I6782 Cerebral ischemia: Secondary | ICD-10-CM | POA: Diagnosis not present

## 2019-08-03 DIAGNOSIS — I10 Essential (primary) hypertension: Secondary | ICD-10-CM | POA: Insufficient documentation

## 2019-08-03 DIAGNOSIS — R609 Edema, unspecified: Secondary | ICD-10-CM | POA: Diagnosis not present

## 2019-08-03 DIAGNOSIS — Z590 Homelessness: Secondary | ICD-10-CM | POA: Insufficient documentation

## 2019-08-03 DIAGNOSIS — R4182 Altered mental status, unspecified: Secondary | ICD-10-CM | POA: Diagnosis present

## 2019-08-03 DIAGNOSIS — F10920 Alcohol use, unspecified with intoxication, uncomplicated: Secondary | ICD-10-CM

## 2019-08-03 DIAGNOSIS — Z79899 Other long term (current) drug therapy: Secondary | ICD-10-CM | POA: Diagnosis not present

## 2019-08-03 LAB — COMPREHENSIVE METABOLIC PANEL
ALT: 20 U/L (ref 0–44)
AST: 23 U/L (ref 15–41)
Albumin: 4.3 g/dL (ref 3.5–5.0)
Alkaline Phosphatase: 48 U/L (ref 38–126)
Anion gap: 12 (ref 5–15)
BUN: 20 mg/dL (ref 8–23)
CO2: 25 mmol/L (ref 22–32)
Calcium: 9.3 mg/dL (ref 8.9–10.3)
Chloride: 101 mmol/L (ref 98–111)
Creatinine, Ser: 1.15 mg/dL (ref 0.61–1.24)
GFR calc Af Amer: >60 mL/min (ref >60)
GFR calc non Af Amer: >60 mL/min (ref >60)
Glucose, Bld: 116 mg/dL — ABNORMAL HIGH (ref 70–99)
Potassium: 3.3 mmol/L — ABNORMAL LOW (ref 3.5–5.1)
Sodium: 138 mmol/L (ref 135–145)
Total Bilirubin: 0.5 mg/dL (ref 0.3–1.2)
Total Protein: 8.5 g/dL — ABNORMAL HIGH (ref 6.5–8.1)

## 2019-08-03 LAB — CBC
HCT: 33.8 % — ABNORMAL LOW (ref 39.0–52.0)
Hemoglobin: 11.2 g/dL — ABNORMAL LOW (ref 13.0–17.0)
MCH: 34.1 pg — ABNORMAL HIGH (ref 26.0–34.0)
MCHC: 33.1 g/dL (ref 30.0–36.0)
MCV: 103 fL — ABNORMAL HIGH (ref 80.0–100.0)
Platelets: 254 10*3/uL (ref 150–400)
RBC: 3.28 MIL/uL — ABNORMAL LOW (ref 4.22–5.81)
RDW: 12.1 % (ref 11.5–15.5)
WBC: 4.6 10*3/uL (ref 4.0–10.5)
nRBC: 0 % (ref 0.0–0.2)

## 2019-08-03 NOTE — ED Provider Notes (Signed)
[facility]-EMERGENCY DEPT Provider Note   CSN: [id number] Arrival date & time: 08/03/19  2051     History Chief Complaint  Patient presents with  . Alcohol Intoxication    Steven Arnold is a 73 y.o. male.  73 yo M with a cc of alcohol intoxication.  Patient was drinking today and stopped at a bus stop.  Picked up by EMS when he refused to leave the stop.  History is difficult due to intoxication.  Level 5 caveat.  The history is provided by the patient.  Alcohol Intoxication This is a new problem. The current episode started yesterday. The problem occurs constantly. The problem has not changed since onset.Pertinent negatives include no chest pain, no abdominal pain, no headaches and no shortness of breath. Nothing aggravates the symptoms. Nothing relieves the symptoms. He has tried nothing for the symptoms. The treatment provided no relief.       History reviewed. No pertinent past medical history.  Patient Active Problem List   Diagnosis Date Noted  . Elevated PSA 06/03/2019  . Tobacco dependence 06/01/2019  . Homeless 06/01/2019  . Hyponatremia 06/01/2019  . Abnormal LFTs 06/01/2019  . Macrocytic anemia 06/01/2019  . Alcohol use disorder, moderate, dependence (HCC) 06/01/2019  . Essential hypertension 06/01/2019  . Nocturnal polyuria 06/01/2019    Past Surgical History:  Procedure Laterality Date  . NO PAST SURGERIES         History reviewed. No pertinent family history.  Social History   Tobacco Use  . Smoking status: Heavy Tobacco Smoker    Packs/day: 0.50  . Smokeless tobacco: Current User    Types: Snuff  Substance Use Topics  . Alcohol use: Yes    Alcohol/week: 6.0 standard drinks    Types: 6 Cans of beer per week  . Drug use: No    Home Medications Prior to Admission medications   Medication Sig Start Date End Date Taking? Authorizing Provider  amLODipine (NORVASC) 5 MG tablet Take 1 tablet (5 mg total) by mouth daily.  06/01/19   Marcine MatarJohnson, Deborah B, MD  folic acid (FOLVITE) 1 MG tablet Take 1 tablet (1 mg total) by mouth daily. 06/01/19   Marcine MatarJohnson, Deborah B, MD  nicotine (NICODERM CQ - DOSED IN MG/24 HOURS) 21 mg/24hr patch Place 1 patch (21 mg total) onto the skin daily. 06/01/19   Marcine MatarJohnson, Deborah B, MD  tamsulosin (FLOMAX) 0.4 MG CAPS capsule Take 1 capsule (0.4 mg total) by mouth daily. 06/01/19   Marcine MatarJohnson, Deborah B, MD  thiamine (VITAMIN B-1) 100 MG tablet Take 1 tablet (100 mg total) by mouth daily. 06/01/19   Marcine MatarJohnson, Deborah B, MD    Allergies    Patient has no known allergies.  Review of Systems   Review of Systems  Unable to perform ROS: Mental status change  Constitutional: Negative for chills and fever.  HENT: Negative for congestion and facial swelling.   Eyes: Negative for discharge and visual disturbance.  Respiratory: Negative for shortness of breath.   Cardiovascular: Negative for chest pain and palpitations.  Gastrointestinal: Negative for abdominal pain, diarrhea and vomiting.  Musculoskeletal: Negative for arthralgias and myalgias.  Skin: Negative for color change and rash.  Neurological: Negative for tremors, syncope and headaches.  Psychiatric/Behavioral: Negative for confusion and dysphoric mood.    Physical Exam Updated Vital Signs BP 137/85 (BP Location: Right Arm)   Pulse 60   Temp 98.2 F (36.8 C) (Oral)   Resp 16   SpO2 99%  Physical Exam Vitals and nursing note reviewed.  Constitutional:      Appearance: He is well-developed.     Comments: Mumbling incoherently.    HENT:     Head: Normocephalic.     Comments: Abrasion to the left eyebrow. Eyes:     Pupils: Pupils are equal, round, and reactive to light.  Neck:     Vascular: No JVD.  Cardiovascular:     Rate and Rhythm: Normal rate and regular rhythm.     Heart sounds: No murmur. No friction rub. No gallop.   Pulmonary:     Effort: No respiratory distress.     Breath sounds: No wheezing.  Abdominal:       General: There is no distension.     Tenderness: There is no guarding or rebound.  Musculoskeletal:        General: Normal range of motion.     Cervical back: Normal range of motion and neck supple.  Skin:    Coloration: Skin is not pale.     Findings: No rash.  Neurological:     Mental Status: He is alert and oriented to person, place, and time.  Psychiatric:        Behavior: Behavior normal.     ED Results / Procedures / Treatments   Labs (all labs ordered are listed, but only abnormal results are displayed) Labs Reviewed  CBC - Abnormal; Notable for the following components:      Result Value   RBC 3.28 (*)    Hemoglobin 11.2 (*)    HCT 33.8 (*)    MCV 103.0 (*)    MCH 34.1 (*)    All other components within normal limits  COMPREHENSIVE METABOLIC PANEL    EKG None  Radiology CT Head Wo Contrast  Result Date: 08/03/2019 CLINICAL DATA:  Intoxication EXAM: CT HEAD WITHOUT CONTRAST TECHNIQUE: Contiguous axial images were obtained from the base of the skull through the vertex without intravenous contrast. COMPARISON:  03/27/2019 FINDINGS: Brain: No evidence of acute infarction, hemorrhage, hydrocephalus, extra-axial collection or mass lesion/mass effect. Scattered low-density changes within the periventricular and subcortical white matter compatible with chronic microvascular ischemic change. Mild-moderate diffuse cerebral volume loss. Vascular: Mild atherosclerotic calcifications involving the large vessels of the skull base. No unexpected hyperdense vessel. Skull: Normal. Negative for fracture or focal lesion. Sinuses/Orbits: Mild diffuse paranasal sinus mucosal thickening, slightly improved compared to the prior study. No acute orbital findings. Other: None. IMPRESSION: 1. No acute intracranial abnormality. 2. Stable cerebral volume loss and chronic microvascular ischemic changes. 3. Mild paranasal sinus disease, slightly improved compared to the prior study. Electronically  Signed   By: Duanne Guess D.O.   On: 08/03/2019 21:48    Procedures Procedures (including critical care time)  Medications Ordered in ED Medications - No data to display  ED Course  I have reviewed the triage vital signs and the nursing notes.  Pertinent labs & imaging results that were available during my care of the patient were reviewed by me and considered in my medical decision making (see chart for details).    MDM Rules/Calculators/A&P                      73 yo M who had reportedly been drinking heavily this morning was found at a bus stop and refused to leave.  EMS was called.  Patient is too drunk to understand what he is saying.  He has an abrasion to the left eyebrow.  CT scan of the head is negative.  Signed out to Dr. Leonette Monarch, please see their note for further details of care in the ED.   The patients results and plan were reviewed and discussed.   Any x-rays performed were independently reviewed by myself.   Differential diagnosis were considered with the presenting HPI.  Medications - No data to display  Vitals:   08/03/19 2100  BP: 137/85  Pulse: 60  Resp: 16  Temp: 98.2 F (36.8 C)  TempSrc: Oral  SpO2: 99%    Final diagnoses:  Alcoholic intoxication without complication [facility])      Final Clinical Impression(s) / ED Diagnoses Final diagnoses:  Alcoholic intoxication without complication [facility])    Rx / DC Orders ED Discharge Orders    None       Deno Etienne, DO 08/03/19 2312

## 2019-08-03 NOTE — ED Triage Notes (Signed)
Pt [place] [facility] [place] dep[place]. PD called EMS because patient wouldn't leave. Pt is intoxicated. He denies falling. A&Ox4.

## 2019-08-04 NOTE — ED Notes (Signed)
Pt put his shoes on and ambulated without assistance.

## 2019-08-04 NOTE — ED Notes (Signed)
Pt resting comfortably

## 2019-08-04 NOTE — ED Notes (Signed)
Pt continues to rest comfortably. Rise and fall of chest noted with respirations.

## 2019-08-04 NOTE — ED Provider Notes (Signed)
I assumed care of this patient from Dr. Adela Lank.  Please see their note for further details of Hx, PE.  Briefly patient is a 74 y.o. male who presented alcohol intoxication. Plan for basic labs and MTF.  Labs reassuring. Patient allowed to MTF.  The patient appears reasonably screened and/or stabilized for discharge and I doubt any other medical condition or other [facility] requiring further screening, evaluation, or treatment in the ED at this time prior to discharge.  The patient is safe for discharge with strict return precautions.   The patient appears reasonably screened and/or stabilized for discharge and I doubt any other medical condition or other [facility] requiring further screening, evaluation, or treatment in the ED at this time prior to discharge.  Disposition: Discharge  Condition: Good  I have discussed the results, Dx and Tx plan with the patient who expressed understanding and agree(s) with the plan. Discharge instructions discussed at great length. The patient was given strict return precautions who verbalized understanding of the instructions. No further questions at time of discharge.    ED Discharge Orders    None       Follow Up: Marcine Matar, MD [address] [place] [address] [phone]  Schedule an appointment as soon as possible for a visit            Martina Brodbeck, Amadeo Garnet, MD 08/04/19 [phone]

## 2019-09-21 IMAGING — CT CT MAXILLOFACIAL W/O CM
5 of 11 series · 16 of 47 positions shown, 18 images · non-contrast
Comparison: None.

CLINICAL DATA: Syncopal episode resulting in a fall, hitting his
left face on asphalt with a resultant left inferior eyelid
laceration. Headache.

EXAM:
CT HEAD WITHOUT CONTRAST
CT MAXILLOFACIAL WITHOUT CONTRAST
CT CERVICAL SPINE WITHOUT CONTRAST
TECHNIQUE: Multidetector CT imaging of the head, cervical spine, and
maxillofacial structures were performed using the standard protocol
without intravenous contrast. Multiplanar CT image reconstructions
of the cervical spine and maxillofacial structures were also
generated.

[Series 5: head bone · axial · 0.42mm/px · z∈[-106,+0]mm · 5 of 81 slices shown, 7 images]
[im 14/81  brain]
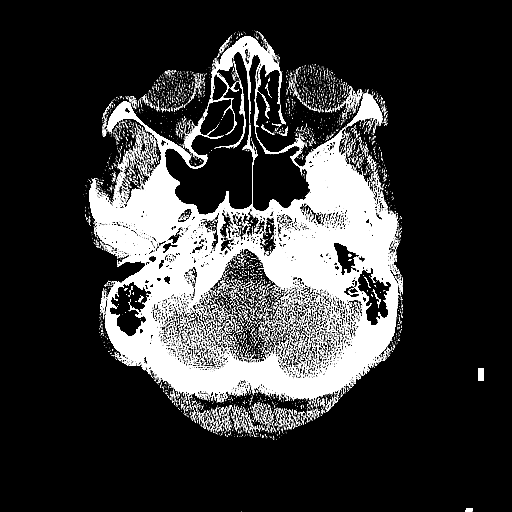
[im 14/81  bone]
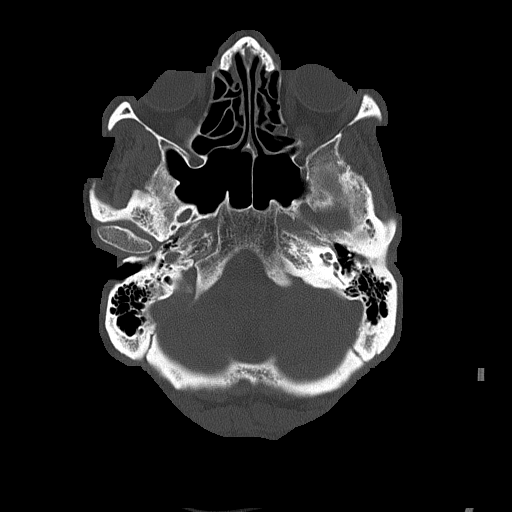
[im 27/81  bone]
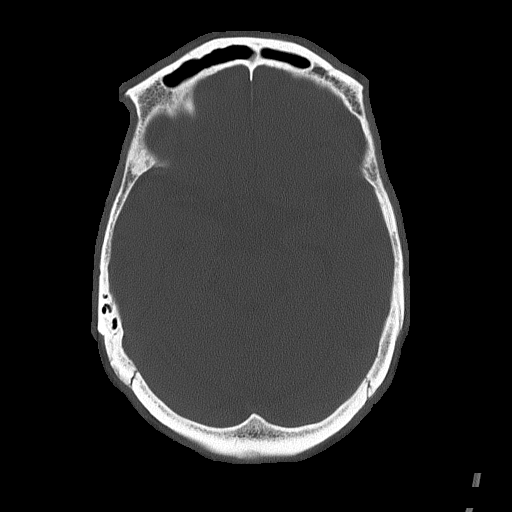
[im 41/81  bone]
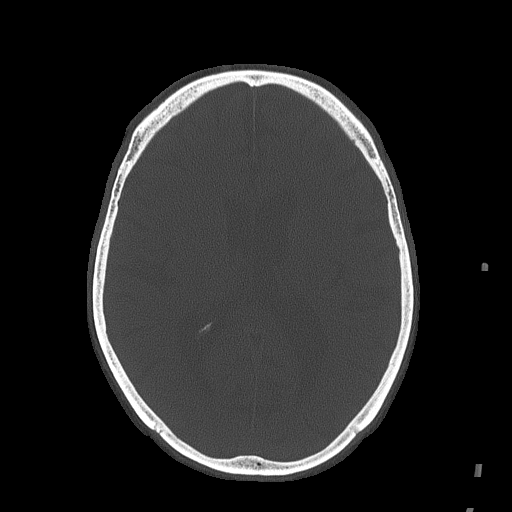
[im 54/81  bone]
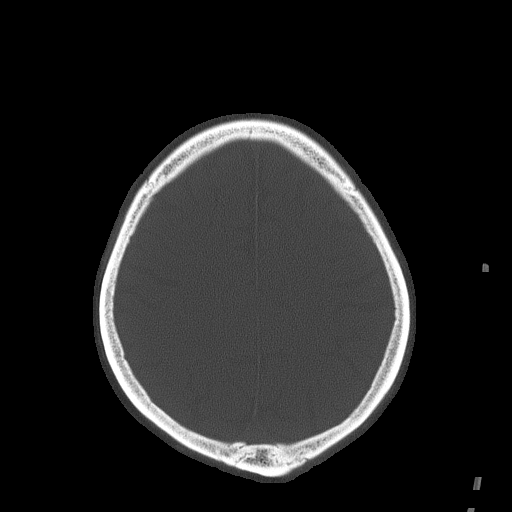
[im 67/81  brain]
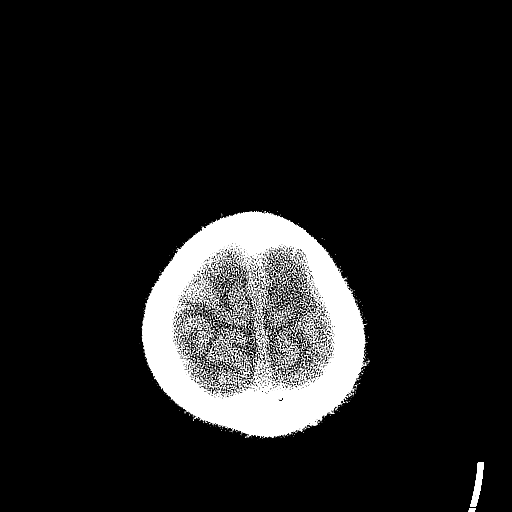
[im 67/81  bone]
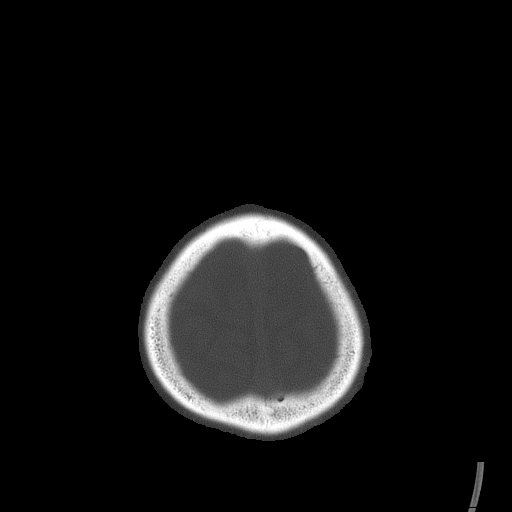

[Series 6: head without cor · coronal · non-contrast · 0.29mm/px · 2 of 67 slices shown]
[im 23/67  bone]
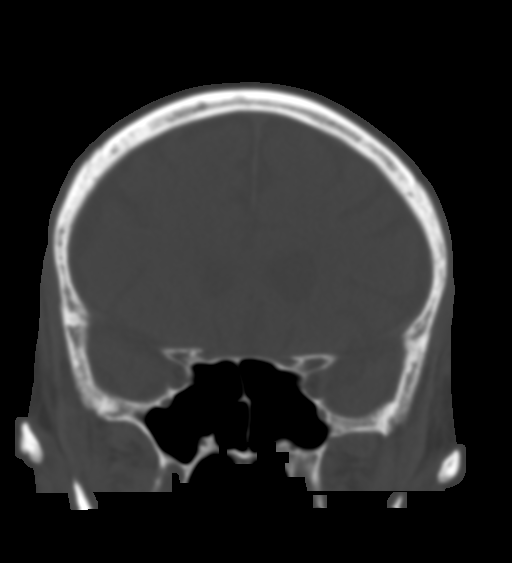
[im 45/67  bone]
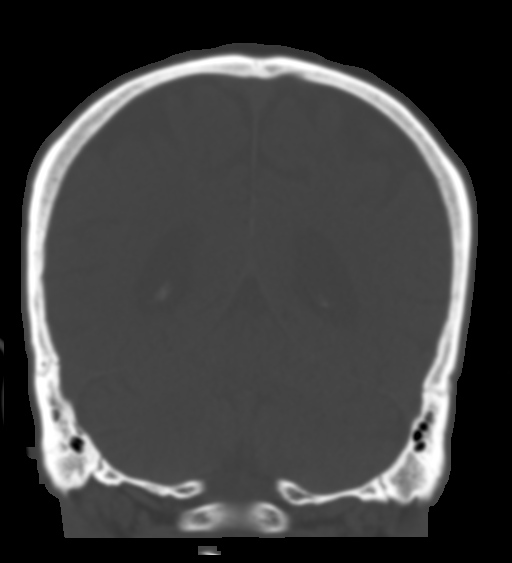

[Series 8: facialbone 2.0 st · axial · 0.34mm/px · z∈[-200,-86]mm · 5 of 87 slices shown]
[im 15/87  bone]
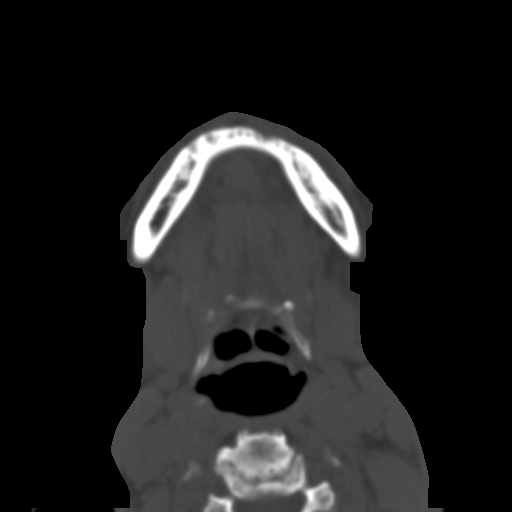
[im 29/87  bone]
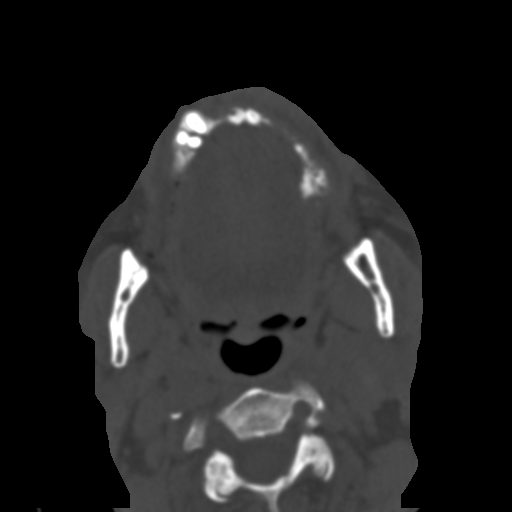
[im 44/87  bone]
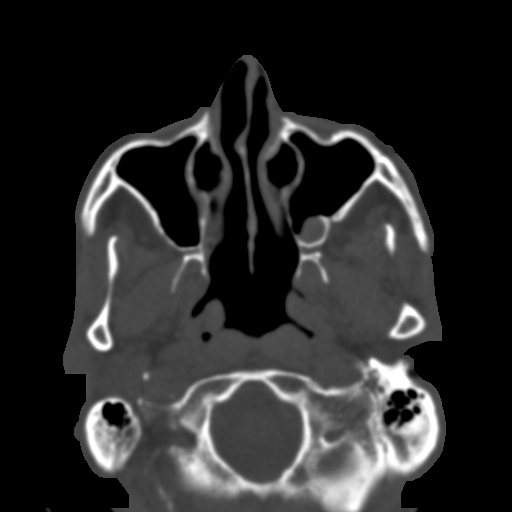
[im 58/87  bone]
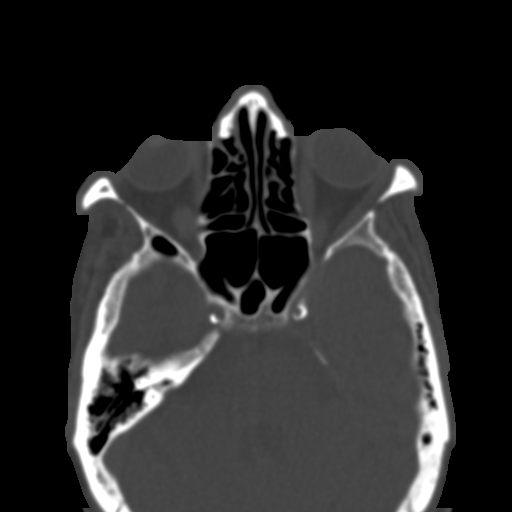
[im 72/87  bone]
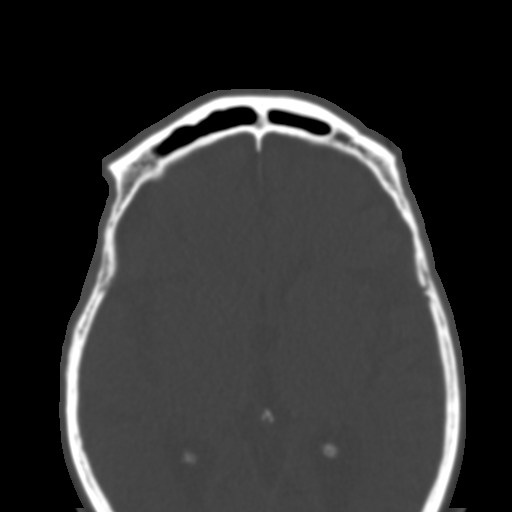

[Series 15: facialbone 2.0 sag st · sagittal · 0.34mm/px · 1 of 80 slices shown]
[im 40/80  bone]
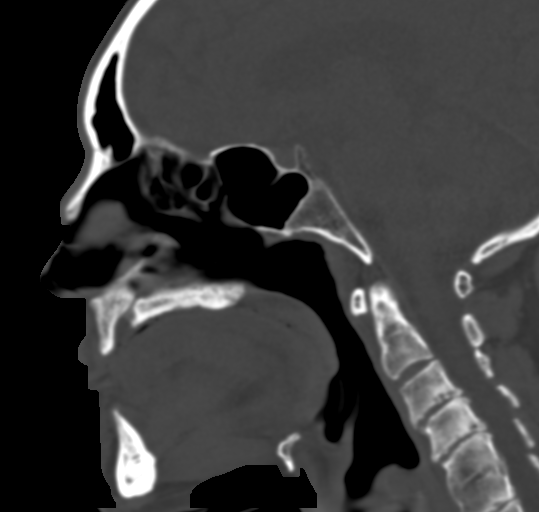

[Series 16: c_spine 2.0 st · axial · 0.25mm/px · z∈[-243,-189]mm · 3 of 81 slices shown]
[im 14/81  bone]
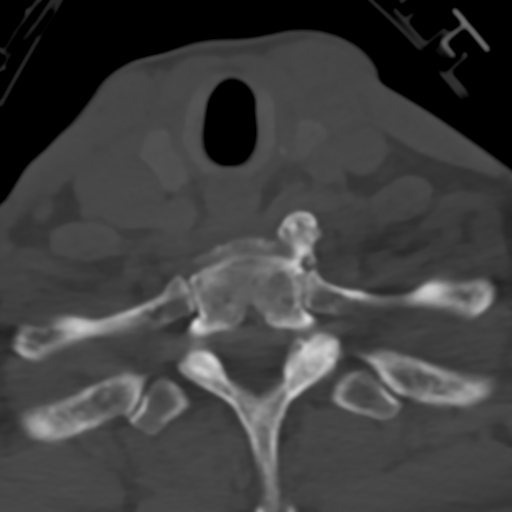
[im 27/81  bone]
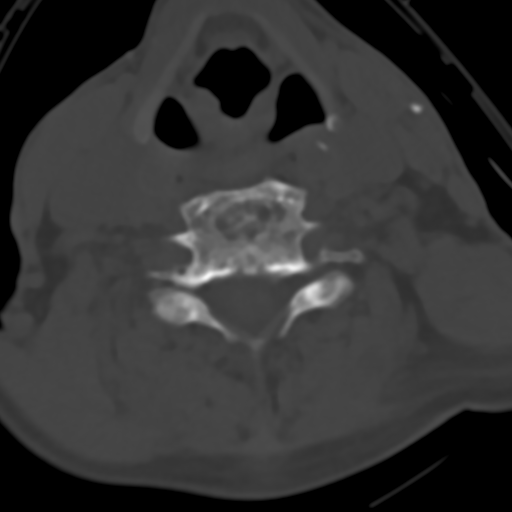
[im 41/81  bone]
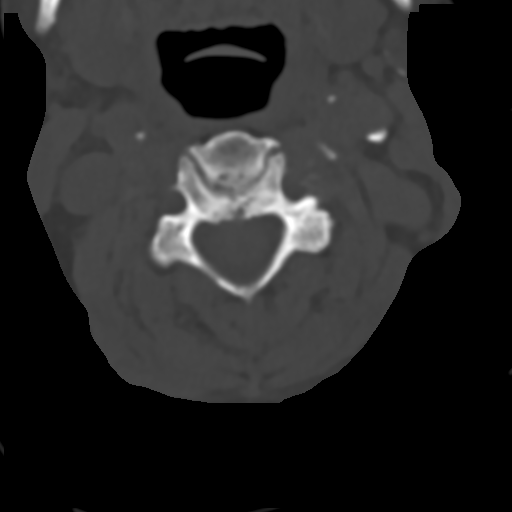

[16 of 47 positions shown; findings below may reference images not displayed]

FINDINGS: CT HEAD FINDINGS

Brain: Diffusely enlarged ventricles and subarachnoid spaces. Patchy
white matter low density in both cerebral hemispheres. No
intracranial hemorrhage, mass lesion or CT evidence of acute
infarction.

Vascular: No hyperdense vessel or unexpected calcification.

Skull: Normal. Negative for fracture or focal lesion.

Other: None.

CT MAXILLOFACIAL FINDINGS

Osseous: No fractures.

Orbits: Negative. No traumatic or inflammatory finding.

Sinuses: Mild right maxillary sinus mucosal thickening. Small left
maxillary sinus retention cyst.

Soft tissues: Mild left periorbital soft tissue swelling and
inferior laceration. Mild skin irregularity of the left cheek area
with small superficial foreign bodies.

CT CERVICAL SPINE FINDINGS

Alignment: Mild reversal of the normal cervical lordosis. No
subluxations.

Skull base and vertebrae: No acute fracture. No primary bone lesion
or focal pathologic process.

Soft tissues and spinal canal: No prevertebral fluid or swelling. No
visible canal hematoma.

Disc levels: Multilevel degenerative changes. Anterior bone fusion
at the C5-6 and C6-7 levels.

Upper chest: Clear lung apices.

Other: Bilateral carotid artery calcifications.
IMPRESSION: 1. No skull fracture or intracranial hemorrhage.
2. No maxillofacial fracture.
3. No cervical spine fracture or subluxation.
4. Mild to moderate diffuse cerebral and cerebellar atrophy.
5. Mild chronic small vessel white matter ischemic changes in both
cerebral hemispheres.
6. Cervical spine degenerative changes.
7. Bilateral carotid artery atheromatous calcifications.
8. Mild chronic right maxillary sinusitis.
9. Left cheek abrasions with small superficial foreign bodies.

## 2020-08-27 ENCOUNTER — Emergency Department (HOSPITAL_COMMUNITY)
Admission: EM | Admit: 2020-08-27 | Discharge: 2020-08-27 | Disposition: A | Discharge status: Home / Self Care | Payer: Medicare Other | Attending: Emergency Medicine | Admitting: Emergency Medicine

## 2020-08-27 DIAGNOSIS — Z79899 Other long term (current) drug therapy: Secondary | ICD-10-CM | POA: Insufficient documentation

## 2020-08-27 DIAGNOSIS — Z760 Encounter for issue of repeat prescription: Secondary | ICD-10-CM | POA: Diagnosis present

## 2020-08-27 DIAGNOSIS — I10 Essential (primary) hypertension: Secondary | ICD-10-CM | POA: Diagnosis not present

## 2020-08-27 DIAGNOSIS — F17200 Nicotine dependence, unspecified, uncomplicated: Secondary | ICD-10-CM | POA: Insufficient documentation

## 2020-08-27 MED ORDER — TAMSULOSIN HCL 0.4 MG PO CAPS: 0.4000 mg | ORAL_CAPSULE | Freq: Every day | ORAL | 2 refills | Status: DC

## 2020-08-27 MED ORDER — TAMSULOSIN HCL 0.4 MG PO CAPS: 0.4000 mg | ORAL_CAPSULE | Freq: Every day | ORAL | 3 refills | Status: DC

## 2020-08-27 NOTE — ED Triage Notes (Signed)
Pt reports he was sent by his pharmacy for a new medication for his prostate. No complaints at this time.

## 2020-08-27 NOTE — ED Provider Notes (Signed)
[facility] EMERGENCY DEPARTMENT Provider Note   CSN: [id number] Arrival date & time: 08/27/20  1656     History Chief Complaint  Patient presents with  . Medication Refill    Steven Arnold is a 75 y.o. male.  The history is provided by the patient. No language interpreter was used.  Medication Refill Medications/supplies requested:  Flomax Reason for request:  Prescriptions expired Medications taken before: yes - see home medications   Patient has complete original prescription information: no    Pt requesting the prostate medication he used to take.  Pt reports he has been taking otc medication but it is not working.  Pt states pharmacist advised him he needs prescription medication.  Pt reports he has not seen his doctor in over 2 years. Pt has seen urology several years ago,    No past medical history on file.  Patient Active Problem List   Diagnosis Date Noted  . Elevated PSA 06/03/2019  . Tobacco dependence 06/01/2019  . Homeless 06/01/2019  . Hyponatremia 06/01/2019  . Abnormal LFTs 06/01/2019  . Macrocytic anemia 06/01/2019  . Alcohol use disorder, moderate, dependence (HCC) 06/01/2019  . Essential hypertension 06/01/2019  . Nocturnal polyuria 06/01/2019    Past Surgical History:  Procedure Laterality Date  . NO PAST SURGERIES         No family history on file.  Social History   Tobacco Use  . Smoking status: Heavy Tobacco Smoker    Packs/day: 0.50  . Smokeless tobacco: Current User    Types: Snuff  Vaping Use  . Vaping Use: Never used  Substance Use Topics  . Alcohol use: Yes    Alcohol/week: 6.0 standard drinks    Types: 6 Cans of beer per week  . Drug use: No    Home Medications Prior to Admission medications   Medication Sig Start Date End Date Taking? Authorizing Provider  amLODipine (NORVASC) 5 MG tablet Take 1 tablet (5 mg total) by mouth daily. 06/01/19   Marcine Matar, MD  folic acid (FOLVITE) 1 MG tablet  Take 1 tablet (1 mg total) by mouth daily. 06/01/19   Marcine Matar, MD  nicotine (NICODERM CQ - DOSED IN MG/24 HOURS) 21 mg/24hr patch Place 1 patch (21 mg total) onto the skin daily. 06/01/19   Marcine Matar, MD  tamsulosin (FLOMAX) 0.4 MG CAPS capsule Take 1 capsule (0.4 mg total) by mouth daily. 06/01/19   Marcine Matar, MD  thiamine (VITAMIN B-1) 100 MG tablet Take 1 tablet (100 mg total) by mouth daily. 06/01/19   Marcine Matar, MD    Allergies    Patient has no known allergies.  Review of Systems   Review of Systems  All other systems reviewed and are negative.   Physical Exam Updated Vital Signs BP (!) 166/94 (BP Location: Left Arm)   Pulse 90   Temp 98 F (36.7 C) (Oral)   Resp 16   SpO2 97%   Physical Exam Vitals and nursing note reviewed.  Constitutional:      Appearance: He is well-developed and well-nourished.  HENT:     Head: Normocephalic.  Eyes:     Extraocular Movements: EOM normal.  Cardiovascular:     Rate and Rhythm: Normal rate and regular rhythm.  Pulmonary:     Effort: Pulmonary effort is normal.     Breath sounds: Normal breath sounds.  Abdominal:     General: Abdomen is flat. There is no distension.  Palpations: Abdomen is soft.  Musculoskeletal:        General: Normal range of motion.     Cervical back: Normal range of motion.  Skin:    General: Skin is warm.  Neurological:     Mental Status: He is alert and oriented to person, place, and time.  Psychiatric:        Mood and Affect: Mood and affect and mood normal.     ED Results / Procedures / Treatments   Labs (all labs ordered are listed, but only abnormal results are displayed) Labs Reviewed - No data to display  EKG None  Radiology No results found.  Procedures Procedures   Medications Ordered in ED Medications - No data to display  ED Course  I have reviewed the triage vital signs and the nursing notes.  Pertinent labs & imaging results that  were available during my care of the patient were reviewed by me and considered in my medical decision making (see chart for details).    MDM Rules/Calculators/A&P                          MDM:  Records reviewed, pt has been on flomax.  Pt has been seen at wellness clinic.  Pt advised to follow up with wellness.  Pt given rx for flomax  Final Clinical Impression(s) / ED Diagnoses Final diagnoses:  Medication refill    Rx / DC Orders ED Discharge Orders         Ordered    tamsulosin (FLOMAX) 0.4 MG CAPS capsule  Daily        08/27/20 2128        An After Visit Summary was printed and given to the patient.    Osie Cheeks 08/27/20 2128    Tegeler, Canary Brim, MD 08/27/20 [phone]

## 2020-08-27 NOTE — Discharge Instructions (Signed)
Return if any problems.

## 2021-05-12 ENCOUNTER — Encounter (HOSPITAL_COMMUNITY): Payer: Self-pay

## 2021-05-12 ENCOUNTER — Other Ambulatory Visit: Payer: Self-pay

## 2021-05-12 ENCOUNTER — Emergency Department (HOSPITAL_COMMUNITY)
Admission: EM | Admit: 2021-05-12 | Discharge: 2021-05-12 | Disposition: A | Discharge status: Home / Self Care | Payer: Medicare Other | Attending: Medical | Admitting: Medical

## 2021-05-12 DIAGNOSIS — F17210 Nicotine dependence, cigarettes, uncomplicated: Secondary | ICD-10-CM | POA: Insufficient documentation

## 2021-05-12 DIAGNOSIS — Z760 Encounter for issue of repeat prescription: Secondary | ICD-10-CM | POA: Diagnosis not present

## 2021-05-12 DIAGNOSIS — Z79899 Other long term (current) drug therapy: Secondary | ICD-10-CM | POA: Insufficient documentation

## 2021-05-12 DIAGNOSIS — I10 Essential (primary) hypertension: Secondary | ICD-10-CM | POA: Insufficient documentation

## 2021-05-12 MED ORDER — TAMSULOSIN HCL 0.4 MG PO CAPS: 0.4000 mg | ORAL_CAPSULE | Freq: Every day | ORAL | 0 refills | Status: AC

## 2021-05-12 NOTE — Discharge Instructions (Addendum)
Please follow up with either your PCP or [facility] for further refills

## 2021-05-12 NOTE — ED Provider Notes (Signed)
[facility] EMERGENCY DEPARTMENT Provider Note   CSN: [id number] Arrival date & time: 05/12/21  2505     History No chief complaint on file.   Steven Arnold is a 75 y.o. male who presents to the ED for medication refill.  Patient is here for refill of his Flomax.  Per chart review he was seen on 1/25 for same and was given a 81-month supply with 2 refills.  Patient states he has not followed up with his PCP or urology for refills however wanted to come to the ED today for same.  He has no other complaints at this time.   The history is provided by the patient and medical records.      History reviewed. No pertinent past medical history.  Patient Active Problem List   Diagnosis Date Noted   Elevated PSA 06/03/2019   Tobacco dependence 06/01/2019   Homeless 06/01/2019   Hyponatremia 06/01/2019   Abnormal LFTs 06/01/2019   Macrocytic anemia 06/01/2019   Alcohol use disorder, moderate, dependence (HCC) 06/01/2019   Essential hypertension 06/01/2019   Nocturnal polyuria 06/01/2019    Past Surgical History:  Procedure Laterality Date   NO PAST SURGERIES         No family history on file.  Social History   Tobacco Use   Smoking status: Heavy Smoker    Packs/day: 0.50    Types: Cigarettes   Smokeless tobacco: Current    Types: Snuff  Vaping Use   Vaping Use: Never used  Substance Use Topics   Alcohol use: Yes    Alcohol/week: 6.0 standard drinks    Types: 6 Cans of beer per week   Drug use: No    Home Medications Prior to Admission medications   Medication Sig Start Date End Date Taking? Authorizing Provider  amLODipine (NORVASC) 5 MG tablet Take 1 tablet (5 mg total) by mouth daily. 06/01/19   Marcine Matar, MD  folic acid (FOLVITE) 1 MG tablet Take 1 tablet (1 mg total) by mouth daily. 06/01/19   Marcine Matar, MD  nicotine (NICODERM CQ - DOSED IN MG/24 HOURS) 21 mg/24hr patch Place 1 patch (21 mg total) onto the skin daily.  06/01/19   Marcine Matar, MD  tamsulosin (FLOMAX) 0.4 MG CAPS capsule Take 1 capsule (0.4 mg total) by mouth daily. 08/27/20   Elson Areas, PA-C  tamsulosin (FLOMAX) 0.4 MG CAPS capsule Take 1 capsule (0.4 mg total) by mouth daily. 05/12/21 06/11/21  Tanda Rockers, PA-C  thiamine (VITAMIN B-1) 100 MG tablet Take 1 tablet (100 mg total) by mouth daily. 06/01/19   Marcine Matar, MD    Allergies    Patient has no known allergies.  Review of Systems   Review of Systems  Constitutional:  Negative for chills and fever.  Gastrointestinal:  Negative for abdominal pain.  Genitourinary:  Negative for difficulty urinating.  All other systems reviewed and are negative.  Physical Exam Updated Vital Signs BP (!) 162/97 (BP Location: Right Arm)   Pulse (!) 55   Temp 97.6 F (36.4 C) (Oral)   Resp 16   SpO2 100%   Physical Exam Vitals and nursing note reviewed.  Constitutional:      Appearance: He is not ill-appearing.  HENT:     Head: Normocephalic and atraumatic.  Eyes:     Conjunctiva/sclera: Conjunctivae normal.  Cardiovascular:     Rate and Rhythm: Normal rate and regular rhythm.  Pulmonary:  Effort: Pulmonary effort is normal.     Breath sounds: Normal breath sounds.  Skin:    General: Skin is warm and dry.     Coloration: Skin is not jaundiced.  Neurological:     Mental Status: He is alert.    ED Results / Procedures / Treatments   Labs (all labs ordered are listed, but only abnormal results are displayed) Labs Reviewed - No data to display  EKG None  Radiology No results found.  Procedures Procedures   Medications Ordered in ED Medications - No data to display  ED Course  I have reviewed the triage vital signs and the nursing notes.  Pertinent labs & imaging results that were available during my care of the patient were reviewed by me and considered in my medical decision making (see chart for details).    MDM Rules/Calculators/A&P                            75 year old male who presents to the ED today for medication refill of his Flomax.  Has not followed with PCP or urology for same.  Has no physical complaints at this time.  On arrival to the ED vitals are stable.  Patient was medically screened by myself and discharged in triage with med refill sent to his pharmacy.  He is advised that he will ultimately need to follow-up with PCP or urology for refills of this medication.  He is in agreement with plan and stable for discharge.   This note was prepared using Dragon voice recognition software and may include unintentional dictation errors due to the inherent limitations of voice recognition software.   Final Clinical Impression(s) / ED Diagnoses Final diagnoses:  Medication refill    Rx / DC Orders ED Discharge Orders          Ordered    tamsulosin (FLOMAX) 0.4 MG CAPS capsule  Daily        05/12/21 0954             Tanda Rockers, PA-C 05/12/21 1641    Melene Plan, DO 05/13/21 [phone]

## 2021-05-12 NOTE — ED Triage Notes (Signed)
Patient here requesting refill for flomax, states he needs the meds for prostate. No other complaints

## 2021-11-26 ENCOUNTER — Encounter: Payer: Self-pay | Admitting: *Deleted

## 2021-11-26 NOTE — Congregational Nurse Program (Signed)
?  Dept: [phone] ? ? ?Congregational Nurse Program Note ? ?Date of Encounter: 11/26/2021 ? ?Past Medical History: ?No past medical history on file. ? ?Encounter Details: ? CNP Questionnaire - 11/26/21 1421   ? ?  ? Questionnaire  ? Do you give verbal consent to treat you today? Yes   ? Location Patient Served  [facility]   ? Visit Setting [place] [address]Phone/Text/Email   ? Patient Status Homeless   ? Insurance Medicaid;Medicare   ? Insurance Referral N/A   ? Medication N/A   ? Medical Provider Yes   ? Screening Referrals N/A   ? Medical Referral N/A   ? Medical Appointment Made N/A   ? Food N/A   ? Transportation N/A   ? Housing/Utilities No permanent housing   ? Interpersonal Safety N/A   ? Intervention Blood pressure;Support   ? ED Visit Averted N/A   ? Life-Saving Intervention Made N/A   ? ?  ?  ? ?  ? ?Client came to nurse's office for a blood pressure check. Blood pressure 140/74, pulse 69. Client reports he has someone working with him on housing and other benefits. He had a card with name and number for assistance. Called and spoke with Debarah Crape [phone]. She is with [facility]. He has an appt today for healthcare needs and she is sending an uber to [facility] to pick him up. Assisted client to [place] driver at [facility]. ?Alfonse Garringer W RN CN  ? ? ?

## 2022-05-13 ENCOUNTER — Emergency Department (HOSPITAL_COMMUNITY)
Admission: EM | Admit: 2022-05-13 | Discharge: 2022-05-14 | Disposition: A | Discharge status: Home / Self Care | Payer: Medicare Other | Attending: Emergency Medicine | Admitting: Emergency Medicine

## 2022-05-13 ENCOUNTER — Emergency Department (HOSPITAL_COMMUNITY): Payer: Medicare Other

## 2022-05-13 ENCOUNTER — Encounter (HOSPITAL_COMMUNITY): Payer: Self-pay

## 2022-05-13 ENCOUNTER — Other Ambulatory Visit: Payer: Self-pay

## 2022-05-13 DIAGNOSIS — M4802 Spinal stenosis, cervical region: Secondary | ICD-10-CM | POA: Insufficient documentation

## 2022-05-13 DIAGNOSIS — R002 Palpitations: Secondary | ICD-10-CM | POA: Insufficient documentation

## 2022-05-13 DIAGNOSIS — R519 Headache, unspecified: Secondary | ICD-10-CM | POA: Insufficient documentation

## 2022-05-13 DIAGNOSIS — R000 Tachycardia, unspecified: Secondary | ICD-10-CM | POA: Diagnosis not present

## 2022-05-13 DIAGNOSIS — H538 Other visual disturbances: Secondary | ICD-10-CM | POA: Diagnosis not present

## 2022-05-13 DIAGNOSIS — R42 Dizziness and giddiness: Secondary | ICD-10-CM | POA: Diagnosis not present

## 2022-05-13 DIAGNOSIS — J320 Chronic maxillary sinusitis: Secondary | ICD-10-CM | POA: Insufficient documentation

## 2022-05-13 DIAGNOSIS — F17210 Nicotine dependence, cigarettes, uncomplicated: Secondary | ICD-10-CM | POA: Insufficient documentation

## 2022-05-13 LAB — COMPREHENSIVE METABOLIC PANEL
ALT: 14 U/L (ref 0–44)
AST: 27 U/L (ref 15–41)
Albumin: 4.2 g/dL (ref 3.5–5.0)
Alkaline Phosphatase: 65 U/L (ref 38–126)
Anion gap: 9 (ref 5–15)
BUN: 8 mg/dL (ref 8–23)
CO2: 24 mmol/L (ref 22–32)
Calcium: 9.3 mg/dL (ref 8.9–10.3)
Chloride: 107 mmol/L (ref 98–111)
Creatinine, Ser: 0.8 mg/dL (ref 0.61–1.24)
GFR, Estimated: >60 mL/min (ref >60)
Glucose, Bld: 88 mg/dL (ref 70–99)
Potassium: 4.5 mmol/L (ref 3.5–5.1)
Sodium: 140 mmol/L (ref 135–145)
Total Bilirubin: 0.8 mg/dL (ref 0.3–1.2)
Total Protein: 8.9 g/dL — ABNORMAL HIGH (ref 6.5–8.1)

## 2022-05-13 LAB — CBC WITH DIFFERENTIAL/PLATELET
Abs Immature Granulocytes: 0.02 10*3/uL (ref 0.00–0.07)
Basophils Absolute: 0.1 10*3/uL (ref 0.0–0.1)
Basophils Relative: 2 %
Eosinophils Absolute: 0.3 10*3/uL (ref 0.0–0.5)
Eosinophils Relative: 8 %
HCT: 35.3 % — ABNORMAL LOW (ref 39.0–52.0)
Hemoglobin: 11.7 g/dL — ABNORMAL LOW (ref 13.0–17.0)
Immature Granulocytes: 1 %
Lymphocytes Relative: 37 %
Lymphs Abs: 1.5 10*3/uL (ref 0.7–4.0)
MCH: 33.9 pg (ref 26.0–34.0)
MCHC: 33.1 g/dL (ref 30.0–36.0)
MCV: 102.3 fL — ABNORMAL HIGH (ref 80.0–100.0)
Monocytes Absolute: 0.3 10*3/uL (ref 0.1–1.0)
Monocytes Relative: 8 %
Neutro Abs: 1.9 10*3/uL (ref 1.7–7.7)
Neutrophils Relative %: 44 %
Platelets: 231 10*3/uL (ref 150–400)
RBC: 3.45 MIL/uL — ABNORMAL LOW (ref 4.22–5.81)
RDW: 13.2 % (ref 11.5–15.5)
WBC: 4.1 10*3/uL (ref 4.0–10.5)
nRBC: 0 % (ref 0.0–0.2)

## 2022-05-13 LAB — TROPONIN I (HIGH SENSITIVITY)
Troponin I (High Sensitivity): 10 ng/L (ref <18)
Troponin I (High Sensitivity): 9 ng/L (ref <18)

## 2022-05-13 LAB — ETHANOL: Alcohol, Ethyl (B): 165 mg/dL — ABNORMAL HIGH (ref <10)

## 2022-05-13 LAB — MAGNESIUM: Magnesium: 1.6 mg/dL — ABNORMAL LOW (ref 1.7–2.4)

## 2022-05-13 NOTE — ED Provider Triage Note (Signed)
Emergency Medicine Provider Triage Evaluation Note  Steven Arnold , a 76 y.o. male  was evaluated in triage.  Pt complains of lightheadedness.  Patient reports that he got on the bus earlier today and started to feel lightheaded and like his heart was racing.  He also bus driver to drive him to the hospital and she called 911.  Denies any chest pain or shortness of breath.  Says that this has been happening intermittently since he got a flu shot 2 months ago.  Also endorses on and off left-sided headaches and blurry vision in his left eye.  No weakness, facial droop, word slurring or confusion in these moments.  No history of CVA  Does report drinking 2 alcoholic drinks a day and last alcohol use being yesterday.  Smokes cigarettes, no illicit drugs  Review of Systems  Positive:  Negative:   Physical Exam  BP (!) 144/90 (BP Location: Right Arm)   Pulse (!) 122   Temp 97.6 F (36.4 C) (Oral)   Resp 18   Ht 5\' 6"  (1.676 m)   Wt 60.8 kg   SpO2 99%   BMI 21.63 kg/m  Gen:   Awake, no distress   Resp:  Normal effort  MSK:   Moves extremities without difficulty  Other:  Cranial nerves II through XII grossly intact.  No problem with finger-nose testing.  EOMs intact without nystagmus.  Moving all extremities with normal strength.  Tachycardic, regular rhythm.  Medical Decision Making  Medically screening exam initiated at 7:58 AM.  Appropriate orders placed.  NORVILLE DANI was informed that the remainder of the evaluation will be completed by another provider, this initial triage assessment does not replace that evaluation, and the importance of remaining in the ED until their evaluation is complete.     Hlee Fringer A, PA-C 05/13/22 0800

## 2022-05-13 NOTE — ED Triage Notes (Addendum)
Patient was drinking alcohol and smoking when he started having a fast heart rate. Tachy 120 resting HR  Complains only of ringing in left ear and when he has it vision gets cloudy/fuzz.  Denies SOB/Chest pain.  Reports left sided jaw pain from sleeping on the bench.   BP 182/90 HR 110 RR18 99% RA

## 2022-05-14 ENCOUNTER — Emergency Department (HOSPITAL_COMMUNITY): Payer: Medicare Other

## 2022-05-14 DIAGNOSIS — R002 Palpitations: Secondary | ICD-10-CM | POA: Diagnosis not present

## 2022-05-14 NOTE — ED Notes (Signed)
Pt transported to MRI 

## 2022-05-14 NOTE — ED Provider Notes (Signed)
[facility] EMERGENCY DEPARTMENT Provider Note   CSN: [id number] Arrival date & time: 05/13/22  4268     History  Chief Complaint  Patient presents with   Tachycardia    Steven Arnold is a 76 y.o. male.  Patient presents to the emergency department for evaluation of ringing in his ears, blurred vision and racing heartbeat.  Symptoms began earlier today.       Home Medications Prior to Admission medications   Medication Sig Start Date End Date Taking? Authorizing Provider  tamsulosin (FLOMAX) 0.4 MG CAPS capsule Take 1 capsule (0.4 mg total) by mouth daily. 08/27/20  Yes Elson Areas, PA-C      Allergies    Patient has no known allergies.    Review of Systems   Review of Systems  Physical Exam Updated Vital Signs BP (!) 172/106 (BP Location: Left Arm)   Pulse 65   Temp 97.9 F (36.6 C) (Oral)   Resp 18   Ht 5\' 6"  (1.676 m)   Wt 60.8 kg   SpO2 98%   BMI 21.63 kg/m  Physical Exam Vitals and nursing note reviewed.  Constitutional:      General: He is not in acute distress.    Appearance: He is well-developed.  HENT:     Head: Normocephalic and atraumatic.     Mouth/Throat:     Mouth: Mucous membranes are moist.  Eyes:     General: Vision grossly intact. Gaze aligned appropriately.     Extraocular Movements: Extraocular movements intact.     Conjunctiva/sclera: Conjunctivae normal.  Cardiovascular:     Rate and Rhythm: Normal rate and regular rhythm.     Pulses: Normal pulses.     Heart sounds: Normal heart sounds, S1 normal and S2 normal. No murmur heard.    No friction rub. No gallop.  Pulmonary:     Effort: Pulmonary effort is normal. No respiratory distress.     Breath sounds: Normal breath sounds.  Abdominal:     Palpations: Abdomen is soft.     Tenderness: There is no abdominal tenderness. There is no guarding or rebound.     Hernia: No hernia is present.  Musculoskeletal:        General: No swelling.     Cervical back:  Full passive range of motion without pain, normal range of motion and neck supple. No pain with movement, spinous process tenderness or muscular tenderness. Normal range of motion.     Right lower leg: No edema.     Left lower leg: No edema.  Skin:    General: Skin is warm and dry.     Capillary Refill: Capillary refill takes less than 2 seconds.     Findings: No ecchymosis, erythema, lesion or wound.  Neurological:     Mental Status: He is alert and oriented to person, place, and time.     GCS: GCS eye subscore is 4. GCS verbal subscore is 5. GCS motor subscore is 6.     Cranial Nerves: Cranial nerves 2-12 are intact.     Sensory: Sensation is intact.     Motor: Motor function is intact. No weakness or abnormal muscle tone.     Coordination: Coordination is intact.  Psychiatric:        Mood and Affect: Mood normal.        Speech: Speech normal.        Behavior: Behavior normal.     ED Results / Procedures / Treatments  Labs (all labs ordered are listed, but only abnormal results are displayed) Labs Reviewed  CBC WITH DIFFERENTIAL/PLATELET - Abnormal; Notable for the following components:      Result Value   RBC 3.45 (*)    Hemoglobin 11.7 (*)    HCT 35.3 (*)    MCV 102.3 (*)    All other components within normal limits  COMPREHENSIVE METABOLIC PANEL - Abnormal; Notable for the following components:   Total Protein 8.9 (*)    All other components within normal limits  MAGNESIUM - Abnormal; Notable for the following components:   Magnesium 1.6 (*)    All other components within normal limits  ETHANOL - Abnormal; Notable for the following components:   Alcohol, Ethyl (B) 165 (*)    All other components within normal limits  RAPID URINE DRUG SCREEN, HOSP PERFORMED  TROPONIN I (HIGH SENSITIVITY)  TROPONIN I (HIGH SENSITIVITY)    EKG EKG Interpretation  Date/Time:  Wednesday May 13 2022 07:55:59 EDT Ventricular Rate:  116 PR Interval:  196 QRS Duration: 70 QT  Interval:  298 QTC Calculation: 414 R Axis:   69 Text Interpretation: Sinus tachycardia Anteroseptal infarct , age undetermined ST & T wave abnormality, consider inferolateral ischemia Abnormal ECG When compared with ECG of 10-Apr-2018 17:17, PREVIOUS ECG IS PRESENT No significant change since last tracing other than increased rate Confirmed by Gilda Crease [phone]) on 05/13/2022 11:09:26 PM  Radiology MR BRAIN WO CONTRAST  Result Date: 05/14/2022 CLINICAL DATA:  76 year old male with lightheadedness, palpitations, intermittent left side headache, left side blurred vision. EXAM: MRI HEAD WITHOUT CONTRAST TECHNIQUE: Multiplanar, multiecho pulse sequences of the brain and surrounding structures were obtained without intravenous contrast. COMPARISON:  Head CT yesterday and earlier. FINDINGS: Brain: No restricted diffusion to suggest acute infarction. No midline shift, mass effect, evidence of mass lesion, ventriculomegaly, extra-axial collection or acute intracranial hemorrhage. Cervicomedullary junction and pituitary are within normal limits.Overall cerebral volume seems within normal limits for age. There is a chronic microhemorrhage in the central pons on series 14, image 16. But otherwise gray and white matter signal is within normal limits for age throughout the brain. No other chronic cerebral blood products. No cortical encephalomalacia identified. Deep gray nuclei and cerebellum appear negative. Vascular: Major intracranial vascular flow voids are preserved. Skull and upper cervical spine: Multilevel cervical spine disc and endplate degeneration. Borderline to mild spinal stenosis at C3-C4. Calvarium bone marrow signal is normal. Sinuses/Orbits: Negative orbits. Moderate right maxillary sinus mucosal thickening and opacification again noted. No sinus fluid levels. Other: Mastoids are clear. Grossly normal visible internal auditory structures. Visible scalp and face appear negative.  IMPRESSION: 1. No acute intracranial abnormality. 2. Solitary chronic microhemorrhage in the central pons, nonspecific. Otherwise normal for age noncontrast MRI appearance of the brain. 3. Right maxillary sinus inflammation. 4. Cervical spine degeneration with borderline or mild spinal stenosis at C3-C4. Electronically Signed   By: Odessa Fleming M.D.   On: 05/14/2022 06:34   CT Head Wo Contrast  Result Date: 05/13/2022 CLINICAL DATA:  Head trauma EXAM: CT HEAD WITHOUT CONTRAST TECHNIQUE: Contiguous axial images were obtained from the base of the skull through the vertex without intravenous contrast. RADIATION DOSE REDUCTION: This exam was performed according to the departmental dose-optimization program which includes automated exposure control, adjustment of the mA and/or kV according to patient size and/or use of iterative reconstruction technique. COMPARISON:  CT head 08/03/2019 FINDINGS: Brain: There is no acute intracranial hemorrhage, extra-axial fluid collection, or acute infarct.  Parenchymal volume is normal for age. The ventricles are normal in size. Patchy hypodensity in the supratentorial white matter likely reflects sequela of underlying mild chronic small vessel ischemic change There is no mass lesion.  There is no mass effect or midline shift. Vascular: There is calcification of the bilateral carotid siphons. Skull: Normal. Negative for fracture or focal lesion. Sinuses/Orbits: There is marked mucosal thickening in the right maxillary sinus with surrounding hyperostosis. There is layering fluid. Globes and orbits are unremarkable. Other: None. IMPRESSION: 1. No acute intracranial pathology. 2. Chronic right maxillary sinusitis with layering fluid which may reflect superimposed acute sinusitis in the correct clinical setting. Electronically Signed   By: Valetta Mole M.D.   On: 05/13/2022 08:58   DG Chest 2 View  Result Date: 05/13/2022 CLINICAL DATA:  Tachycardia and palpitations. EXAM: CHEST - 2  VIEW COMPARISON:  None FINDINGS: Heart size and mediastinal contours are normal. There is no pleural effusion or edema. Lungs appear hyperinflated but clear. Visualized osseous structures are unremarkable. IMPRESSION: 1. No acute findings. 2. Hyperinflation. Electronically Signed   By: Kerby Moors M.D.   On: 05/13/2022 08:11    Procedures Procedures    Medications Ordered in ED Medications - No data to display  ED Course/ Medical Decision Making/ A&P                           Medical Decision Making Amount and/or Complexity of Data Reviewed Radiology: ordered.   Presented for evaluation of multiple problems.  Patient could hear ringing in his ears and felt like he was experiencing blurred vision earlier.  This has mostly resolved.  He did have racing heartbeat which she has had intermittently in the past and this has resolved by the time I evaluated him.  Tracings reveal that his initial heart rate was sinus tachycardia.  This has resolved.  He does have a history of alcohol abuse but he did not appear to be in withdrawal.  Lab work was unremarkable.  Patient had already underwent CT head prior to my evaluation.  Because of his visual changes an MRI was performed.  No evidence of stroke noted.  Patient's work-up has been reassuring and I do not feel that he requires paralyzation at this time.  He will be discharged and can follow-up with his primary care doctor.        Final Clinical Impression(s) / ED Diagnoses Final diagnoses:  Palpitations    Rx / DC Orders ED Discharge Orders     None         Kemari Mares, Gwenyth Allegra, MD 05/14/22 [phone]

## 2022-05-17 ENCOUNTER — Emergency Department (HOSPITAL_COMMUNITY): Payer: Medicare Other

## 2022-05-17 ENCOUNTER — Observation Stay (HOSPITAL_COMMUNITY)
Admission: EM | Admit: 2022-05-17 | Discharge: 2022-05-19 | Disposition: A | Discharge status: Home / Self Care | Payer: Medicare Other | Attending: Student | Admitting: Student

## 2022-05-17 ENCOUNTER — Encounter (HOSPITAL_COMMUNITY): Payer: Self-pay | Admitting: Emergency Medicine

## 2022-05-17 DIAGNOSIS — Z5900 Homelessness unspecified: Secondary | ICD-10-CM | POA: Diagnosis not present

## 2022-05-17 DIAGNOSIS — I10 Essential (primary) hypertension: Secondary | ICD-10-CM | POA: Diagnosis present

## 2022-05-17 DIAGNOSIS — F1090 Alcohol use, unspecified, uncomplicated: Secondary | ICD-10-CM | POA: Diagnosis not present

## 2022-05-17 DIAGNOSIS — E872 Acidosis, unspecified: Secondary | ICD-10-CM | POA: Insufficient documentation

## 2022-05-17 DIAGNOSIS — Y9301 Activity, walking, marching and hiking: Secondary | ICD-10-CM | POA: Diagnosis not present

## 2022-05-17 DIAGNOSIS — E871 Hypo-osmolality and hyponatremia: Secondary | ICD-10-CM | POA: Diagnosis present

## 2022-05-17 DIAGNOSIS — F17210 Nicotine dependence, cigarettes, uncomplicated: Secondary | ICD-10-CM | POA: Diagnosis not present

## 2022-05-17 DIAGNOSIS — S32502A Unspecified fracture of left pubis, initial encounter for closed fracture: Principal | ICD-10-CM | POA: Insufficient documentation

## 2022-05-17 DIAGNOSIS — W19XXXA Unspecified fall, initial encounter: Secondary | ICD-10-CM | POA: Insufficient documentation

## 2022-05-17 DIAGNOSIS — Z1152 Encounter for screening for COVID-19: Secondary | ICD-10-CM | POA: Diagnosis not present

## 2022-05-17 DIAGNOSIS — S32592A Other specified fracture of left pubis, initial encounter for closed fracture: Secondary | ICD-10-CM | POA: Diagnosis present

## 2022-05-17 DIAGNOSIS — N400 Enlarged prostate without lower urinary tract symptoms: Secondary | ICD-10-CM

## 2022-05-17 DIAGNOSIS — M25552 Pain in left hip: Secondary | ICD-10-CM | POA: Diagnosis present

## 2022-05-17 DIAGNOSIS — F1020 Alcohol dependence, uncomplicated: Secondary | ICD-10-CM | POA: Diagnosis present

## 2022-05-17 DIAGNOSIS — E876 Hypokalemia: Secondary | ICD-10-CM | POA: Diagnosis not present

## 2022-05-17 DIAGNOSIS — F17200 Nicotine dependence, unspecified, uncomplicated: Secondary | ICD-10-CM | POA: Diagnosis present

## 2022-05-17 DIAGNOSIS — D62 Acute posthemorrhagic anemia: Secondary | ICD-10-CM | POA: Insufficient documentation

## 2022-05-17 DIAGNOSIS — Z79899 Other long term (current) drug therapy: Secondary | ICD-10-CM | POA: Insufficient documentation

## 2022-05-17 DIAGNOSIS — D649 Anemia, unspecified: Secondary | ICD-10-CM

## 2022-05-17 HISTORY — DX: Essential (primary) hypertension: I10

## 2022-05-17 HISTORY — DX: Homelessness unspecified: Z59.00

## 2022-05-17 HISTORY — DX: Alcohol abuse, uncomplicated: F10.10

## 2022-05-17 LAB — URINALYSIS, ROUTINE W REFLEX MICROSCOPIC
Bacteria, UA: NONE SEEN
Bilirubin Urine: NEGATIVE
Glucose, UA: NEGATIVE mg/dL
Hgb urine dipstick: NEGATIVE
Ketones, ur: 5 mg/dL — AB
Leukocytes,Ua: NEGATIVE
Nitrite: NEGATIVE
Protein, ur: 30 mg/dL — AB
Specific Gravity, Urine: 1.015 (ref 1.005–1.030)
pH: 5 (ref 5.0–8.0)

## 2022-05-17 LAB — CBC WITH DIFFERENTIAL/PLATELET
Abs Immature Granulocytes: 0.02 10*3/uL (ref 0.00–0.07)
Basophils Absolute: 0 10*3/uL (ref 0.0–0.1)
Basophils Relative: 0 %
Eosinophils Absolute: 0 10*3/uL (ref 0.0–0.5)
Eosinophils Relative: 0 %
HCT: 31.5 % — ABNORMAL LOW (ref 39.0–52.0)
Hemoglobin: 11.1 g/dL — ABNORMAL LOW (ref 13.0–17.0)
Immature Granulocytes: 0 %
Lymphocytes Relative: 11 %
Lymphs Abs: 0.6 10*3/uL — ABNORMAL LOW (ref 0.7–4.0)
MCH: 34.5 pg — ABNORMAL HIGH (ref 26.0–34.0)
MCHC: 35.2 g/dL (ref 30.0–36.0)
MCV: 97.8 fL (ref 80.0–100.0)
Monocytes Absolute: 0.4 10*3/uL (ref 0.1–1.0)
Monocytes Relative: 8 %
Neutro Abs: 4.8 10*3/uL (ref 1.7–7.7)
Neutrophils Relative %: 81 %
Platelets: 208 10*3/uL (ref 150–400)
RBC: 3.22 MIL/uL — ABNORMAL LOW (ref 4.22–5.81)
RDW: 12.4 % (ref 11.5–15.5)
WBC: 5.9 10*3/uL (ref 4.0–10.5)
nRBC: 0 % (ref 0.0–0.2)

## 2022-05-17 LAB — LIPASE, BLOOD: Lipase: 45 U/L (ref 11–51)

## 2022-05-17 LAB — LACTIC ACID, PLASMA
Lactic Acid, Venous: 4.9 mmol/L (ref 0.5–1.9)
Lactic Acid, Venous: 2.4 mmol/L (ref 0.5–1.9)

## 2022-05-17 LAB — COMPREHENSIVE METABOLIC PANEL
ALT: 19 U/L (ref 0–44)
AST: 38 U/L (ref 15–41)
Albumin: 4 g/dL (ref 3.5–5.0)
Alkaline Phosphatase: 62 U/L (ref 38–126)
Anion gap: 16 — ABNORMAL HIGH (ref 5–15)
BUN: 24 mg/dL — ABNORMAL HIGH (ref 8–23)
CO2: 24 mmol/L (ref 22–32)
Calcium: 9.4 mg/dL (ref 8.9–10.3)
Chloride: 94 mmol/L — ABNORMAL LOW (ref 98–111)
Creatinine, Ser: 0.99 mg/dL (ref 0.61–1.24)
GFR, Estimated: >60 mL/min (ref >60)
Glucose, Bld: 89 mg/dL (ref 70–99)
Potassium: 4.6 mmol/L (ref 3.5–5.1)
Sodium: 134 mmol/L — ABNORMAL LOW (ref 135–145)
Total Bilirubin: 0.8 mg/dL (ref 0.3–1.2)
Total Protein: 8.1 g/dL (ref 6.5–8.1)

## 2022-05-17 LAB — RAPID URINE DRUG SCREEN, HOSP PERFORMED
Amphetamines: NOT DETECTED
Barbiturates: NOT DETECTED
Benzodiazepines: NOT DETECTED
Cocaine: NOT DETECTED
Opiates: NOT DETECTED
Tetrahydrocannabinol: POSITIVE — AB

## 2022-05-17 LAB — ETHANOL: Alcohol, Ethyl (B): <10 mg/dL (ref <10)

## 2022-05-17 LAB — RESP PANEL BY RT-PCR (FLU A&B, COVID) ARPGX2
Influenza A by PCR: NEGATIVE
Influenza B by PCR: NEGATIVE
SARS Coronavirus 2 by RT PCR: NEGATIVE

## 2022-05-17 MED ORDER — OXYCODONE-ACETAMINOPHEN 5-325 MG PO TABS
1.0000 | ORAL_TABLET | Freq: Four times a day (QID) | ORAL | Status: DC | PRN
  Administered 2022-05-18 – 2022-05-19 (×2): 1 via ORAL
  Filled 2022-05-17 (×2): qty 1

## 2022-05-17 MED ORDER — IOHEXOL 350 MG/ML SOLN
75.0000 mL | Freq: Once | INTRAVENOUS | Status: AC | PRN
  Administered 2022-05-17: 75 mL via INTRAVENOUS

## 2022-05-17 MED ORDER — ONDANSETRON HCL 4 MG PO TABS: 4.0000 mg | ORAL_TABLET | Freq: Four times a day (QID) | ORAL | Status: DC | PRN

## 2022-05-17 MED ORDER — LORAZEPAM 2 MG/ML IJ SOLN: 1.0000 mg | INTRAMUSCULAR | Status: DC | PRN

## 2022-05-17 MED ORDER — LACTATED RINGERS IV BOLUS
1000.0000 mL | Freq: Once | INTRAVENOUS | Status: AC
  Administered 2022-05-17: 1000 mL via INTRAVENOUS

## 2022-05-17 MED ORDER — FOLIC ACID 1 MG PO TABS
1.0000 mg | ORAL_TABLET | Freq: Every day | ORAL | Status: DC
  Administered 2022-05-18 – 2022-05-19 (×2): 1 mg via ORAL
  Filled 2022-05-17 (×2): qty 1

## 2022-05-17 MED ORDER — ADULT MULTIVITAMIN W/MINERALS CH
1.0000 | ORAL_TABLET | Freq: Every day | ORAL | Status: DC
  Administered 2022-05-18 – 2022-05-19 (×2): 1 via ORAL
  Filled 2022-05-17 (×2): qty 1

## 2022-05-17 MED ORDER — LORAZEPAM 1 MG PO TABS: 1.0000 mg | ORAL_TABLET | ORAL | Status: DC | PRN

## 2022-05-17 MED ORDER — THIAMINE MONONITRATE 100 MG PO TABS
100.0000 mg | ORAL_TABLET | Freq: Every day | ORAL | Status: DC
  Administered 2022-05-18 – 2022-05-19 (×2): 100 mg via ORAL
  Filled 2022-05-17 (×2): qty 1

## 2022-05-17 MED ORDER — ACETAMINOPHEN 325 MG PO TABS: 650.0000 mg | ORAL_TABLET | Freq: Four times a day (QID) | ORAL | Status: DC | PRN

## 2022-05-17 MED ORDER — FENTANYL CITRATE PF 50 MCG/ML IJ SOSY
50.0000 ug | PREFILLED_SYRINGE | Freq: Once | INTRAMUSCULAR | Status: AC
  Administered 2022-05-17: 50 ug via INTRAVENOUS
  Filled 2022-05-17: qty 1

## 2022-05-17 MED ORDER — ACETAMINOPHEN 650 MG RE SUPP: 650.0000 mg | Freq: Four times a day (QID) | RECTAL | Status: DC | PRN

## 2022-05-17 MED ORDER — ONDANSETRON HCL 4 MG/2ML IJ SOLN: 4.0000 mg | Freq: Four times a day (QID) | INTRAMUSCULAR | Status: DC | PRN

## 2022-05-17 MED ORDER — THIAMINE HCL 100 MG/ML IJ SOLN
100.0000 mg | Freq: Every day | INTRAMUSCULAR | Status: DC
  Filled 2022-05-17: qty 1

## 2022-05-17 MED ORDER — ENOXAPARIN SODIUM 40 MG/0.4ML IJ SOSY
40.0000 mg | PREFILLED_SYRINGE | INTRAMUSCULAR | Status: DC
  Filled 2022-05-17: qty 0.4

## 2022-05-17 NOTE — Assessment & Plan Note (Signed)
SW consult  

## 2022-05-17 NOTE — Assessment & Plan Note (Addendum)
1. NWB for moment. 2. EDP spoke with [facility], ortho will see in AM 1. I presume this will most likely be non-operative as pubic rami fx typically are, will let pt eat and put on lovenox DVT ppx 3. PT/OT 4. SW consult, but I have a strong feeling that this guy isnt going to let us put him in a SNF for several weeks while he heals up if he is able to at all ambulate (cant drink EtOH in SNF I dont think)\ 5. Percocet PRN pain

## 2022-05-17 NOTE — ED Provider Triage Note (Signed)
Emergency Medicine Provider Triage Evaluation Note  CAPERS HAGMANN , a 76 y.o. male  was evaluated in triage.  Pt complains of hip pain. Report pain to L hip down to leg this AM, felt nauseous and vomited once.  No other complaint.  Denies abd pain, dysuria.  Is tachycardic  Review of Systems  Positive: As above Negative: As above  Physical Exam  BP 104/81 (BP Location: Right Arm)   Pulse (!) 126   Temp 98.6 F (37 C)   Resp 18   SpO2 96%  Gen:   Awake, no distress   Resp:  Normal effort  MSK:   Moves extremities without difficulty  Other:    Medical Decision Making  Medically screening exam initiated at 5:02 PM.  Appropriate orders placed.  CANNEN DUPRAS was informed that the remainder of the evaluation will be completed by another provider, this initial triage assessment does not replace that evaluation, and the importance of remaining in the ED until their evaluation is complete.  tachycardia   Domenic Moras, PA-C 05/17/22 1706

## 2022-05-17 NOTE — ED Notes (Signed)
Patient transported to CT 

## 2022-05-17 NOTE — ED Provider Notes (Signed)
[facility] EMERGENCY DEPARTMENT Provider Note   CSN: [id number] Arrival date & time: 05/17/22  1628     History  Chief Complaint  Patient presents with   Hip Pain    Steven Arnold is a 76 y.o. male.  HPI 76 year old male presents with hip pain.  He states that today he was walking and developed left hip pain.  He states that he did drink some alcohol yesterday but not today.  He is not sure if he fell but for some reason his left hip is hurting.  He denies any type of headache, chest pain, shortness of breath, cough.  No fevers.  He did vomit a couple times today.  He denies any abdominal pain.  No weakness in the leg.  Home Medications Prior to Admission medications   Medication Sig Start Date End Date Taking? Authorizing Provider  tamsulosin (FLOMAX) 0.4 MG CAPS capsule Take 1 capsule (0.4 mg total) by mouth daily. 08/27/20   Elson Areas, PA-C      Allergies    Patient has no known allergies.    Review of Systems   Review of Systems  Constitutional:  Negative for fever.  Respiratory:  Negative for shortness of breath.   Cardiovascular:  Negative for chest pain.  Gastrointestinal:  Positive for vomiting. Negative for abdominal pain.  Genitourinary:  Negative for dysuria.  Musculoskeletal:  Positive for arthralgias. Negative for back pain.  Neurological:  Negative for weakness and numbness.    Physical Exam Updated Vital Signs BP (!) 150/89   Pulse (!) 109   Temp 99.9 F (37.7 C) (Rectal)   Resp (!) 24   SpO2 91%  Physical Exam Vitals and nursing note reviewed.  Constitutional:      Appearance: He is well-developed.  HENT:     Head: Normocephalic and atraumatic.  Cardiovascular:     Rate and Rhythm: Regular rhythm. Tachycardia present.     Pulses:          Dorsalis pedis pulses are 2+ on the left side.     Heart sounds: Normal heart sounds.  Pulmonary:     Effort: Pulmonary effort is normal.     Breath sounds: Normal breath sounds.   Abdominal:     General: There is no distension.     Palpations: Abdomen is soft.     Tenderness: There is no abdominal tenderness. There is no right CVA tenderness or left CVA tenderness.     Hernia: There is no hernia in the left inguinal area or right inguinal area.  Musculoskeletal:     Left hip: Tenderness present. No deformity. Decreased range of motion.     Comments: No obvious abscess/cellulitis to the left hip region or buttocks I can passively range his left hip, but when he tries to move it it causes significant pain.  Skin:    General: Skin is warm and dry.  Neurological:     Mental Status: He is alert.     ED Results / Procedures / Treatments   Labs (all labs ordered are listed, but only abnormal results are displayed) Labs Reviewed  CBC WITH DIFFERENTIAL/PLATELET - Abnormal; Notable for the following components:      Result Value   RBC 3.22 (*)    Hemoglobin 11.1 (*)    HCT 31.5 (*)    MCH 34.5 (*)    Lymphs Abs 0.6 (*)    All other components within normal limits  COMPREHENSIVE METABOLIC PANEL - Abnormal;  Notable for the following components:   Sodium 134 (*)    Chloride 94 (*)    BUN 24 (*)    Anion gap 16 (*)    All other components within normal limits  URINALYSIS, ROUTINE W REFLEX MICROSCOPIC - Abnormal; Notable for the following components:   APPearance HAZY (*)    Ketones, ur 5 (*)    Protein, ur 30 (*)    All other components within normal limits  LACTIC ACID, PLASMA - Abnormal; Notable for the following components:   Lactic Acid, Venous 4.9 (*)    All other components within normal limits  RAPID URINE DRUG SCREEN, HOSP PERFORMED - Abnormal; Notable for the following components:   Tetrahydrocannabinol POSITIVE (*)    All other components within normal limits  CULTURE, BLOOD (ROUTINE X 2)  CULTURE, BLOOD (ROUTINE X 2)  RESP PANEL BY RT-PCR (FLU A&B, COVID) ARPGX2  LIPASE, BLOOD  LACTIC ACID, PLASMA  ETHANOL    EKG EKG  Interpretation  Date/Time:  Sunday May 17 2022 20:38:04 EDT Ventricular Rate:  92 PR Interval:  198 QRS Duration: 87 QT Interval:  322 QTC Calculation: 399 R Axis:   88 Text Interpretation: Sinus rhythm Borderline right axis deviation Borderline repolarization abnormality poor data quality V3 otherwise similar to May 13 2022 Confirmed by Pricilla Loveless [phone]) on 05/17/2022 8:56:22 PM  Radiology CT ABDOMEN PELVIS W CONTRAST  Addendum Date: 05/17/2022   ADDENDUM REPORT: 05/17/2022 22:46 ADDENDUM: After discussing the case with the emergency physician, fractures are noted within the left superior and inferior pubic rami. Electronically Signed   By: Charlett Nose M.D.   On: 05/17/2022 22:46   Result Date: 05/17/2022 CLINICAL DATA:  Nausea, vomiting EXAM: CT ABDOMEN AND PELVIS WITH CONTRAST TECHNIQUE: Multidetector CT imaging of the abdomen and pelvis was performed using the standard protocol following bolus administration of intravenous contrast. RADIATION DOSE REDUCTION: This exam was performed according to the departmental dose-optimization program which includes automated exposure control, adjustment of the mA and/or kV according to patient size and/or use of iterative reconstruction technique. CONTRAST:  11mL OMNIPAQUE IOHEXOL 350 MG/ML SOLN COMPARISON:  None Available. FINDINGS: Lower chest: Coronary artery and aortic calcifications. There is indistinctness of the distal esophagus which appears thick walled. This is concerning for esophagitis. No effusions. Hepatobiliary: Layering gallstones within the gallbladder. No biliary ductal dilatation or focal hepatic abnormality. Pancreas: No focal abnormality or ductal dilatation. Spleen: No focal abnormality.  Normal size.  New Adrenals/Urinary Tract: Adrenal glands unremarkable. Bilateral renal cysts, the largest in the right midpole measuring 3.6 cm. No follow-up imaging recommended. No renal or ureteral stones. No hydronephrosis. Urinary  bladder unremarkable. Stomach/Bowel: Normal appendix. Stomach, large and small bowel grossly unremarkable. Vascular/Lymphatic: Heavily calcified aorta and iliac vessels. No evidence of aneurysm or adenopathy. Reproductive: Prostate enlargement Other: No free fluid or free air. Musculoskeletal: No acute bony abnormality. IMPRESSION: Thickened, indistinct distal esophagus suggesting esophagitis. Coronary artery disease, aortic atherosclerosis. Cholelithiasis. Prostate enlargement. Electronically Signed: By: Charlett Nose M.D. On: 05/17/2022 21:57   DG Hip Unilat W or Wo Pelvis 2-3 Views Left  Result Date: 05/17/2022 CLINICAL DATA:  Left pain, tachycardia. EXAM: DG HIP (WITH OR WITHOUT PELVIS) 2-3V LEFT COMPARISON:  None Available. FINDINGS: There is cortical irregularity of the superior pubic ramus suggesting acute fracture. There is also cortical irregularity of the inferior pubic ramus also suspicious for a mildly displaced fracture. Mild left hip osteoarthritis. Prominent vascular calcifications. IMPRESSION: Mildly displaced fracture of the left superior pubic  ramus. Findings suspicious for mildly displaced fracture of the left inferior pubic ramus. Electronically Signed   By: Keane Police D.O.   On: 05/17/2022 21:11   DG Chest Portable 1 View  Result Date: 05/17/2022 CLINICAL DATA:  Left hip pain, tachycardia EXAM: PORTABLE CHEST 1 VIEW COMPARISON:  05/13/2022 FINDINGS: The heart size and mediastinal contours are within normal limits. Both lungs are clear. The visualized skeletal structures are unremarkable. IMPRESSION: No acute abnormality of the lungs in AP portable projection. Electronically Signed   By: Delanna Ahmadi M.D.   On: 05/17/2022 21:09    Procedures .Critical Care  Performed by: Sherwood Gambler, MD Authorized by: Sherwood Gambler, MD   Critical care provider statement:    Critical care time (minutes):  35   Critical care time was exclusive of:  Separately billable procedures and  treating other patients   Critical care was necessary to treat or prevent imminent or life-threatening deterioration of the following conditions:  Circulatory failure and sepsis   Critical care was time spent personally by me on the following activities:  Development of treatment plan with patient or surrogate, discussions with consultants, evaluation of patient's response to treatment, examination of patient, ordering and review of laboratory studies, ordering and review of radiographic studies, ordering and performing treatments and interventions, pulse oximetry, re-evaluation of patient's condition and review of old charts     Medications Ordered in ED Medications  lactated ringers bolus 1,000 mL (1,000 mLs Intravenous New Bag/Given 05/17/22 2236)  fentaNYL (SUBLIMAZE) injection 50 mcg (50 mcg Intravenous Given 05/17/22 2102)  lactated ringers bolus 1,000 mL (0 mLs Intravenous Stopped 05/17/22 2237)  iohexol (OMNIPAQUE) 350 MG/ML injection 75 mL (75 mLs Intravenous Contrast Given 05/17/22 2143)  fentaNYL (SUBLIMAZE) injection 50 mcg (50 mcg Intravenous Given 05/17/22 2258)    ED Course/ Medical Decision Making/ A&P Clinical Course as of 05/17/22 2305  Sun May 17, 2022  2029 Patient is tachycardic on my assessment.  It is unclear why his lactate is 4.9.  At this point he is not febrile and his WBC is normal.  I am not sure if this is really sepsis.  We will add on alcohol level given his history of alcohol intoxication.  He will of course need a repeat check of his lactate and we will start IV fluids and pain medicine.  We will start work-up.  For now given is not clear if this is sepsis or not I will hold off on antibiotics. [SG]  2120 Heart rate is improved with fluids and pain control.  He is not febrile.  X-ray images viewed by myself and he does appear to have a superior and inferior pubic ramus fracture which goes along with why he is having hip pain.  Given the lactate and the vomiting  will get CT of abdomen/pelvis for further evaluation.  However this is seeming like it is less likely to be infectious/sepsis. [SG]    Clinical Course User Index [SG] Sherwood Gambler, MD                           Medical Decision Making Amount and/or Complexity of Data Reviewed External Data Reviewed: notes. Labs: ordered.    Details: Lactate 4.9, no clear infectious cause.  Mild anemia similar to baseline.  No significant electrolyte disturbance.  Mild anion gap acidosis. Radiology: ordered and independent interpretation performed.    Details: No pneumonia on chest x-ray.  Has superior/inferior pubic ramus  fracture on the hip x-ray. CT without obvious abscess.  Risk Prescription drug management. Decision regarding hospitalization.   Patient presents with left hip pain.  He does not remember any type of fall yesterday and though he did state he drank alcohol and he does have a known history of alcohol use disorder.  He was tachycardic on arrival though this did get better with some fluids and pain control.  His x-rays do show pelvic fractures as above and he does have an abrasion to his right knee so I suspect he did fall yesterday and just does not remember.  However he is not altered at this time and has no head injury so do not think further scans are needed.  However with the elevated lactate (obtained in triage), further testing was done to evaluate for possible infection.  No clear source has been found at this time.  Pete lactate is pending after fluids.  He has remained afebrile.  I discussed his orthopedic injuries with Dr. Jena Gauss, who advises him to be nonweightbearing for now and they will see in the morning.  Discussed with Julian Reil.        Final Clinical Impression(s) / ED Diagnoses Final diagnoses:  Closed fracture of multiple pubic rami, left, initial encounter (HCC)  Lactic acidosis    Rx / DC Orders ED Discharge Orders     None         Pricilla Loveless,  MD 05/17/22 [phone]

## 2022-05-17 NOTE — Assessment & Plan Note (Addendum)
CIWA Suspect dehydration causing lactic acidosis today, getting repeat lactate after 2L IVF bolus.

## 2022-05-17 NOTE — ED Triage Notes (Signed)
Patient BIB [facility] [place] side [place] [address] (patient is homeless) with compliant of left hip pain. Denies injury, pain started today, VSS.  BP 130/78 HR 108 RR 18 96% on room air CBG 107

## 2022-05-18 ENCOUNTER — Other Ambulatory Visit: Payer: Self-pay

## 2022-05-18 ENCOUNTER — Encounter (HOSPITAL_COMMUNITY): Payer: Self-pay | Admitting: Internal Medicine

## 2022-05-18 DIAGNOSIS — I10 Essential (primary) hypertension: Secondary | ICD-10-CM

## 2022-05-18 DIAGNOSIS — E876 Hypokalemia: Secondary | ICD-10-CM

## 2022-05-18 DIAGNOSIS — F17200 Nicotine dependence, unspecified, uncomplicated: Secondary | ICD-10-CM

## 2022-05-18 DIAGNOSIS — F1020 Alcohol dependence, uncomplicated: Secondary | ICD-10-CM | POA: Diagnosis not present

## 2022-05-18 DIAGNOSIS — S32592A Other specified fracture of left pubis, initial encounter for closed fracture: Secondary | ICD-10-CM

## 2022-05-18 DIAGNOSIS — N400 Enlarged prostate without lower urinary tract symptoms: Secondary | ICD-10-CM

## 2022-05-18 DIAGNOSIS — E872 Acidosis, unspecified: Secondary | ICD-10-CM

## 2022-05-18 DIAGNOSIS — S32502A Unspecified fracture of left pubis, initial encounter for closed fracture: Secondary | ICD-10-CM | POA: Diagnosis not present

## 2022-05-18 DIAGNOSIS — Z5900 Homelessness unspecified: Secondary | ICD-10-CM | POA: Diagnosis not present

## 2022-05-18 LAB — BASIC METABOLIC PANEL
Anion gap: 13 (ref 5–15)
BUN: 19 mg/dL (ref 8–23)
CO2: 25 mmol/L (ref 22–32)
Calcium: 8.6 mg/dL — ABNORMAL LOW (ref 8.9–10.3)
Chloride: 95 mmol/L — ABNORMAL LOW (ref 98–111)
Creatinine, Ser: 1.03 mg/dL (ref 0.61–1.24)
GFR, Estimated: >60 mL/min (ref >60)
Glucose, Bld: 85 mg/dL (ref 70–99)
Potassium: 3.3 mmol/L — ABNORMAL LOW (ref 3.5–5.1)
Sodium: 133 mmol/L — ABNORMAL LOW (ref 135–145)

## 2022-05-18 LAB — CBC
HCT: 24.1 % — ABNORMAL LOW (ref 39.0–52.0)
HCT: 24.9 % — ABNORMAL LOW (ref 39.0–52.0)
Hemoglobin: 8.5 g/dL — ABNORMAL LOW (ref 13.0–17.0)
Hemoglobin: 8.7 g/dL — ABNORMAL LOW (ref 13.0–17.0)
MCH: 34.6 pg — ABNORMAL HIGH (ref 26.0–34.0)
MCH: 35.2 pg — ABNORMAL HIGH (ref 26.0–34.0)
MCHC: 35.3 g/dL (ref 30.0–36.0)
MCHC: 34.9 g/dL (ref 30.0–36.0)
MCV: 98 fL (ref 80.0–100.0)
MCV: 100.8 fL — ABNORMAL HIGH (ref 80.0–100.0)
Platelets: 163 10*3/uL (ref 150–400)
Platelets: 164 10*3/uL (ref 150–400)
RBC: 2.46 MIL/uL — ABNORMAL LOW (ref 4.22–5.81)
RBC: 2.47 MIL/uL — ABNORMAL LOW (ref 4.22–5.81)
RDW: 12.1 % (ref 11.5–15.5)
RDW: 12.4 % (ref 11.5–15.5)
WBC: 5.7 10*3/uL (ref 4.0–10.5)
WBC: 6.3 10*3/uL (ref 4.0–10.5)
nRBC: 0 % (ref 0.0–0.2)
nRBC: 0 % (ref 0.0–0.2)

## 2022-05-18 LAB — RETICULOCYTES
Immature Retic Fract: 4.7 % (ref 2.3–15.9)
RBC.: 2.44 MIL/uL — ABNORMAL LOW (ref 4.22–5.81)
Retic Count, Absolute: 32.9 10*3/uL (ref 19.0–186.0)
Retic Ct Pct: 1.4 % (ref 0.4–3.1)

## 2022-05-18 LAB — IRON AND TIBC
Iron: 56 ug/dL (ref 45–182)
Saturation Ratios: 28 % (ref 17.9–39.5)
TIBC: 199 ug/dL — ABNORMAL LOW (ref 250–450)
UIBC: 143 ug/dL

## 2022-05-18 LAB — VITAMIN B12: Vitamin B-12: 536 pg/mL (ref 180–914)

## 2022-05-18 LAB — FERRITIN: Ferritin: 851 ng/mL — ABNORMAL HIGH (ref 24–336)

## 2022-05-18 LAB — MAGNESIUM: Magnesium: 1.3 mg/dL — ABNORMAL LOW (ref 1.7–2.4)

## 2022-05-18 LAB — FOLATE: Folate: 12.1 ng/mL (ref >5.9)

## 2022-05-18 MED ORDER — TAMSULOSIN HCL 0.4 MG PO CAPS
0.4000 mg | ORAL_CAPSULE | Freq: Every day | ORAL | Status: DC
  Administered 2022-05-18: 0.4 mg via ORAL
  Filled 2022-05-18 (×2): qty 1

## 2022-05-18 MED ORDER — MAGNESIUM SULFATE 4 GM/100ML IV SOLN
4.0000 g | Freq: Once | INTRAVENOUS | Status: AC
  Administered 2022-05-18: 4 g via INTRAVENOUS
  Filled 2022-05-18: qty 100

## 2022-05-18 MED ORDER — ENOXAPARIN SODIUM 40 MG/0.4ML IJ SOSY
40.0000 mg | PREFILLED_SYRINGE | INTRAMUSCULAR | Status: DC
  Administered 2022-05-18: 40 mg via SUBCUTANEOUS
  Filled 2022-05-18: qty 0.4

## 2022-05-18 MED ORDER — NICOTINE 14 MG/24HR TD PT24
14.0000 mg | MEDICATED_PATCH | Freq: Every day | TRANSDERMAL | Status: DC
  Administered 2022-05-18 – 2022-05-19 (×2): 14 mg via TRANSDERMAL
  Filled 2022-05-18 (×2): qty 1

## 2022-05-18 MED ORDER — POTASSIUM CHLORIDE CRYS ER 20 MEQ PO TBCR
40.0000 meq | EXTENDED_RELEASE_TABLET | ORAL | Status: AC
  Administered 2022-05-18 (×2): 40 meq via ORAL
  Filled 2022-05-18 (×2): qty 2

## 2022-05-18 MED ORDER — LABETALOL HCL 5 MG/ML IV SOLN: 5.0000 mg | INTRAVENOUS | Status: DC | PRN

## 2022-05-18 NOTE — Progress Notes (Signed)
PROGRESS NOTE  Steven Arnold [id number] DOB: 04-06-1946   PCP: Ladell Pier, MD  Patient is from: Homeless.  DOA: 05/17/2022 LOS: 0  Chief complaints Chief Complaint  Patient presents with   Hip Pain     Brief Narrative / Interim history: 76 year old M with PMH of ongoing EtOH abuse, HTN, homelessness and tobacco use disorder presenting with left hip pain.  Patient is unsure of trauma or fall.  X-ray showed mildly displaced fracture of left superior and inferior pubic rami.  CT abdomen and pelvis with thickened distal distal esophagus suggesting esophagitis, cholelithiasis and enlarged prostate.  Orthopedic surgery consulted, recommended WBAT and outpatient follow-up with Dr. Doreatha Martin in 3 weeks.  TOC consulted as well. Therapy recommended RW.  Subjective: Seen and examined earlier this morning.  No major events overnight of this morning.  Reports improvement in his pain but pain is worse with movement.  He states his pain gets to 7/10 with minimal activity and weightbearing.  No other complaints.  Has no recollection of fall or trauma.  Reports drinking 2-3 of 16 ounce beers a day.  Also reports smoking about 7 cigarettes a day.  Objective: Vitals:   05/18/22 0530 05/18/22 0943 05/18/22 1006 05/18/22 1319  BP: (!) 152/75 124/75  (!) 160/81  Pulse: 95 74  94  Resp:  16  16  Temp:  98.9 F (37.2 C)  98.9 F (37.2 C)  TempSrc:  Oral  Oral  SpO2:  98%  96%  Weight:   60.8 kg   Height:   5\' 6"  (1.676 m)     Examination:  GENERAL: No apparent distress.  Nontoxic. HEENT: MMM.  Vision and hearing grossly intact.  NECK: Supple.  No apparent JVD.  RESP:  No IWOB.  Fair aeration bilaterally. CVS:  RRR. Heart sounds normal.  ABD/GI/GU: BS+. Abd soft, NTND.  MSK/EXT:  Moves extremities. No apparent deformity. No edema.  SKIN: no apparent skin lesion or wound NEURO: Awake, alert and oriented appropriately.  No apparent focal neuro deficit. PSYCH: Calm. Normal affect.    Procedures:  None  Microbiology summarized: [id number] and influenza PCR nonreactive. Blood cultures NGTD  Assessment and plan: Principal Problem:   Fracture of multiple pubic rami, left, closed, initial encounter ([place]) Active Problems:   Alcohol use disorder, moderate, dependence (HCC)   Homeless   Tobacco dependence   Essential hypertension   BPH (benign prostatic hyperplasia)  Fracture of multiple pubic rami, left, closed, initial encounter: Likely traumatic although patient has no recollection of trauma or fall.  Has alcohol use disorder.  Still with significant pain with weightbearing and activity -Appreciate input by orthopedic surgery-WBAT on LLE and outpatient follow-up with Dr. Myrene Galas in 3 weeks -Pain control -TOC consulted about homelessness -PT/OT   Alcohol use disorder, moderate, dependence: No withdrawal symptoms. -Continue CIWA with as needed Ativan -Multivitamin, folic acid and thiamine -TOC consulted  Normocytic anemia: Likely dilutional.  Patient denies melena or hematochezia.  CT abdomen and pelvis did not show hematoma or bleeding. Recent Labs    05/13/22 0821 05/17/22 1740 05/18/22 0440 05/18/22 1133  HGB 11.7* 11.1* 8.5* 8.7*  -Check anemia panel -Monitor  Hypokalemia/hypomagnesemia/hyponatremia: Likely from alcohol. -Monitor replenish as appropriate  Lactic acidosis: Likely from dehydration and alcohol.  Resolved.  Tobacco use disorder -Encouraged smoking cessation. -Nicotine patch   Homeless -TOC consulted.  Body mass index is 21.63 kg/m.          DVT prophylaxis:  Place and maintain sequential compression device  Start: 05/18/22 0829  Code Status: Full code Family Communication: None at bedside Level of care: Med-Surg Status is: Observation The patient remains OBS appropriate and will d/c before 2 midnights.   Final disposition: TBD Consultants:  Orthopedic surgery  Sch Meds:  Scheduled Meds:  folic acid  1 mg Oral  Daily   multivitamin with minerals  1 tablet Oral Daily   thiamine  100 mg Oral Daily   Or   thiamine  100 mg Intravenous Daily   Continuous Infusions:   PRN Meds:.acetaminophen **OR** acetaminophen, labetalol, LORazepam **OR** LORazepam, ondansetron **OR** ondansetron (ZOFRAN) IV, oxyCODONE-acetaminophen  Antimicrobials: Anti-infectives (From admission, onward)    None        I have personally reviewed the following labs and images: CBC: Recent Labs  Lab 05/13/22 0821 05/17/22 1740 05/18/22 0440 05/18/22 1133  WBC 4.1 5.9 5.7 6.3  NEUTROABS 1.9 4.8  --   --   HGB 11.7* 11.1* 8.5* 8.7*  HCT 35.3* 31.5* 24.1* 24.9*  MCV 102.3* 97.8 98.0 100.8*  PLT 231 208 163 164   BMP &GFR Recent Labs  Lab 05/13/22 0821 05/17/22 1740 05/18/22 0440  NA 140 134* 133*  K 4.5 4.6 3.3*  CL 107 94* 95*  CO2 24 24 25   GLUCOSE 88 89 85  BUN 8 24* 19  CREATININE 0.80 0.99 1.03  CALCIUM 9.3 9.4 8.6*  MG 1.6*  --  1.3*   Estimated Creatinine Clearance: 54.1 mL/min (by C-G formula based on SCr of 1.03 mg/dL). Liver & Pancreas: Recent Labs  Lab 05/13/22 0821 05/17/22 1740  AST 27 38  ALT 14 19  ALKPHOS 65 62  BILITOT 0.8 0.8  PROT 8.9* 8.1  ALBUMIN 4.2 4.0   Recent Labs  Lab 05/17/22 1740  LIPASE 45   No results for input(s): "AMMONIA" in the last 168 hours. Diabetic: No results for input(s): "HGBA1C" in the last 72 hours. No results for input(s): "GLUCAP" in the last 168 hours. Cardiac Enzymes: No results for input(s): "CKTOTAL", "CKMB", "CKMBINDEX", "TROPONINI" in the last 168 hours. No results for input(s): "PROBNP" in the last 8760 hours. Coagulation Profile: No results for input(s): "INR", "PROTIME" in the last 168 hours. Thyroid Function Tests: No results for input(s): "TSH", "T4TOTAL", "FREET4", "T3FREE", "THYROIDAB" in the last 72 hours. Lipid Profile: No results for input(s): "CHOL", "HDL", "LDLCALC", "TRIG", "CHOLHDL", "LDLDIRECT" in the last 72  hours. Anemia Panel: Recent Labs    05/18/22 1132 05/18/22 1133  VITAMINB12  --  536  FOLATE 12.1  --   FERRITIN  --  851*  TIBC  --  199*  IRON  --  56  RETICCTPCT  --  1.4   Urine analysis:    Component Value Date/Time   COLORURINE YELLOW 05/17/2022 2126   APPEARANCEUR HAZY (A) 05/17/2022 2126   LABSPEC 1.015 05/17/2022 2126   PHURINE 5.0 05/17/2022 2126   GLUCOSEU NEGATIVE 05/17/2022 2126   HGBUR NEGATIVE 05/17/2022 2126   BILIRUBINUR NEGATIVE 05/17/2022 2126   KETONESUR 5 (A) 05/17/2022 2126   PROTEINUR 30 (A) 05/17/2022 2126   NITRITE NEGATIVE 05/17/2022 2126   LEUKOCYTESUR NEGATIVE 05/17/2022 2126   Sepsis Labs: Invalid input(s): "PROCALCITONIN", "LACTICIDVEN"  Microbiology: Recent Results (from the past 240 hour(s))  Culture, blood (routine x 2)     Status: None (Preliminary result)   Collection Time: 05/17/22  8:59 PM   Specimen: BLOOD RIGHT FOREARM  Result Value Ref Range Status   Specimen Description BLOOD RIGHT FOREARM  Final  Special Requests   Final    BOTTLES DRAWN AEROBIC AND ANAEROBIC Blood Culture adequate volume   Culture   Final    NO GROWTH < 12 HOURS Performed at [facility], [place] [address], [place], [place] [address]    Report Status PENDING  Incomplete  Culture, blood (routine x 2)     Status: None (Preliminary result)   Collection Time: 05/17/22  9:26 PM   Specimen: BLOOD LEFT FOREARM  Result Value Ref Range Status   Specimen Description BLOOD LEFT FOREARM  Final   Special Requests   Final    BOTTLES DRAWN AEROBIC AND ANAEROBIC Blood Culture adequate volume   Culture   Final    NO GROWTH < 12 HOURS Performed at [facility], [place] [address] [place], [place] [address]    Report Status PENDING  Incomplete  Resp Panel by RT-PCR (Flu A&B, Covid) Anterior Nasal Swab     Status: None   Collection Time: 05/17/22  9:41 PM   Specimen: Anterior Nasal Swab  Result Value Ref Range Status   SARS Coronavirus 2 by RT PCR NEGATIVE  NEGATIVE Final    Comment: (NOTE) SARS-CoV-2 target nucleic acids are NOT DETECTED.  The SARS-CoV-2 RNA is generally detectable in upper respiratory specimens during the acute phase of infection. The lowest concentration of SARS-CoV-2 viral copies this assay can detect is 138 copies/mL. A negative result does not preclude SARS-Cov-2 infection and should not be used as the sole basis for treatment or other patient management decisions. A negative result may occur with  improper specimen collection/handling, submission of specimen other than nasopharyngeal swab, presence of viral mutation(s) within the areas targeted by this assay, and inadequate number of viral copies(<138 copies/mL). A negative result must be combined with clinical observations, patient history, and epidemiological information. The expected result is Negative.  Fact Sheet for Patients:  EntrepreneurPulse.com.au  Fact Sheet for Healthcare Providers:  IncredibleEmployment.be  This test is no t yet approved or cleared by the [place] FDA and  has been authorized for detection and/or diagnosis of SARS-CoV-2 by FDA under an Emergency Use Authorization (EUA). This EUA will remain  in effect (meaning this test can be used) for the duration of the COVID-19 declaration under Section 564(b)(1) of the Act, 21 U.S.C.section 360bbb-3(b)(1), unless the authorization is terminated  or revoked sooner.       Influenza A by PCR NEGATIVE NEGATIVE Final   Influenza B by PCR NEGATIVE NEGATIVE Final    Comment: (NOTE) The Xpert Xpress SARS-CoV-2/FLU/RSV plus assay is intended as an aid in the diagnosis of influenza from Nasopharyngeal swab specimens and should not be used as a sole basis for treatment. Nasal washings and aspirates are unacceptable for Xpert Xpress SARS-CoV-2/FLU/RSV testing.  Fact Sheet for Patients: EntrepreneurPulse.com.au  Fact Sheet for Healthcare  Providers: IncredibleEmployment.be  This test is not yet approved or cleared by the [place] FDA and has been authorized for detection and/or diagnosis of SARS-CoV-2 by FDA under an Emergency Use Authorization (EUA). This EUA will remain in effect (meaning this test can be used) for the duration of the COVID-19 declaration under Section 564(b)(1) of the Act, 21 U.S.C. section 360bbb-3(b)(1), unless the authorization is terminated or revoked.  Performed at [facility], [place] [address], [place], [place] [address]     Radiology Studies: CT ABDOMEN PELVIS W CONTRAST  Addendum Date: 05/17/2022   ADDENDUM REPORT: 05/17/2022 22:46 ADDENDUM: After discussing the case with the emergency  physician, fractures are noted within the left superior and inferior pubic rami. Electronically Signed   By: Rolm Baptise M.D.   On: 05/17/2022 22:46   Result Date: 05/17/2022 CLINICAL DATA:  Nausea, vomiting EXAM: CT ABDOMEN AND PELVIS WITH CONTRAST TECHNIQUE: Multidetector CT imaging of the abdomen and pelvis was performed using the standard protocol following bolus administration of intravenous contrast. RADIATION DOSE REDUCTION: This exam was performed according to the departmental dose-optimization program which includes automated exposure control, adjustment of the mA and/or kV according to patient size and/or use of iterative reconstruction technique. CONTRAST:  13mL OMNIPAQUE IOHEXOL 350 MG/ML SOLN COMPARISON:  None Available. FINDINGS: Lower chest: Coronary artery and aortic calcifications. There is indistinctness of the distal esophagus which appears thick walled. This is concerning for esophagitis. No effusions. Hepatobiliary: Layering gallstones within the gallbladder. No biliary ductal dilatation or focal hepatic abnormality. Pancreas: No focal abnormality or ductal dilatation. Spleen: No focal abnormality.  Normal size.  New Adrenals/Urinary Tract: Adrenal glands unremarkable.  Bilateral renal cysts, the largest in the right midpole measuring 3.6 cm. No follow-up imaging recommended. No renal or ureteral stones. No hydronephrosis. Urinary bladder unremarkable. Stomach/Bowel: Normal appendix. Stomach, large and small bowel grossly unremarkable. Vascular/Lymphatic: Heavily calcified aorta and iliac vessels. No evidence of aneurysm or adenopathy. Reproductive: Prostate enlargement Other: No free fluid or free air. Musculoskeletal: No acute bony abnormality. IMPRESSION: Thickened, indistinct distal esophagus suggesting esophagitis. Coronary artery disease, aortic atherosclerosis. Cholelithiasis. Prostate enlargement. Electronically Signed: By: Rolm Baptise M.D. On: 05/17/2022 21:57   DG Hip Unilat W or Wo Pelvis 2-3 Views Left  Result Date: 05/17/2022 CLINICAL DATA:  Left pain, tachycardia. EXAM: DG HIP (WITH OR WITHOUT PELVIS) 2-3V LEFT COMPARISON:  None Available. FINDINGS: There is cortical irregularity of the superior pubic ramus suggesting acute fracture. There is also cortical irregularity of the inferior pubic ramus also suspicious for a mildly displaced fracture. Mild left hip osteoarthritis. Prominent vascular calcifications. IMPRESSION: Mildly displaced fracture of the left superior pubic ramus. Findings suspicious for mildly displaced fracture of the left inferior pubic ramus. Electronically Signed   By: Keane Police D.O.   On: 05/17/2022 21:11   DG Chest Portable 1 View  Result Date: 05/17/2022 CLINICAL DATA:  Left hip pain, tachycardia EXAM: PORTABLE CHEST 1 VIEW COMPARISON:  05/13/2022 FINDINGS: The heart size and mediastinal contours are within normal limits. Both lungs are clear. The visualized skeletal structures are unremarkable. IMPRESSION: No acute abnormality of the lungs in AP portable projection. Electronically Signed   By: Delanna Ahmadi M.D.   On: 05/17/2022 21:09      Delfina Schreurs T. [place]  If 7PM-7AM, please contact  night-coverage [email or web address] 05/18/2022, 3:45 PM

## 2022-05-18 NOTE — Evaluation (Signed)
Physical Therapy Evaluation Patient Details Name: Steven Arnold MRN: [id number] DOB: 10/24/1945 Today's Date: 05/18/2022  History of Present Illness  Pt is a 76 y/o male who presented with L hip pain. CT showed multiple fx of L pubic rami - nonoperative mgmt per ortho. PMH: alcohol use disorder, HTN, homelessness  Clinical Impression  Pt admitted secondary to problem above with deficits below. Pt requiring min guard for mobility tasks this session. Tolerated ambulation well with use of RW. Discussed use of RW at d/c to increase safety. Anticipate pt will progress well. Reports he can likely stay with a friend at d/c. Will continue to follow acutely.        Recommendations for follow up therapy are one component of a multi-disciplinary discharge planning process, led by the attending physician.  Recommendations may be updated based on patient status, additional functional criteria and insurance authorization.  Follow Up Recommendations No PT follow up      Assistance Recommended at Discharge Intermittent Supervision/Assistance  Patient can return home with the following  Assistance with cooking/housework;Assist for transportation    Equipment Recommendations Rolling walker (2 wheels)  Recommendations for Other Services       Functional Status Assessment Patient has had a recent decline in their functional status and demonstrates the ability to make significant improvements in function in a reasonable and predictable amount of time.     Precautions / Restrictions Precautions Precautions: Fall Restrictions Weight Bearing Restrictions: Yes LLE Weight Bearing: Weight bearing as tolerated      Mobility  Bed Mobility Overal bed mobility: Needs Assistance Bed Mobility: Supine to Sit, Sit to Supine     Supine to sit: Min guard Sit to supine: Min assist   General bed mobility comments: Min A for LLE management to return to supine    Transfers Overall transfer level: Needs  assistance Equipment used: Rolling walker (2 wheels) Transfers: Sit to/from Stand Sit to Stand: Min guard           General transfer comment: Min guard for safety    Ambulation/Gait Ambulation/Gait assistance: Min guard Gait Distance (Feet): 100 Feet Assistive device: Rolling walker (2 wheels) Gait Pattern/deviations: Step-through pattern, Decreased stride length, Decreased weight shift to left Gait velocity: Decreased     General Gait Details: Guarded, mildly antalgic gait. Pt reports improved tolerance for mobility using RW. Min guard for safety.  Stairs            Wheelchair Mobility    Modified Rankin (Stroke Patients Only)       Balance Overall balance assessment: Needs assistance Sitting-balance support: No upper extremity supported, Feet supported Sitting balance-Leahy Scale: Good     Standing balance support: Bilateral upper extremity supported, During functional activity Standing balance-Leahy Scale: Fair Standing balance comment: benefits from RW to offload painful LE                             Pertinent Vitals/Pain Pain Assessment Pain Assessment: Faces Faces Pain Scale: Hurts a little bit Pain Location: L LE Pain Descriptors / Indicators: Grimacing Pain Intervention(s): Limited activity within patient's tolerance, Monitored during session, Repositioned    Home Living Family/patient expects to be discharged to:: Shelter/Homeless                   Additional Comments: pt reports he may have some family/friends he could stay with    Prior Function Prior Level of Function : Independent/Modified Independent  Mobility Comments: ambulatory without AD, uses the bus in the community for longer distances prn       Hand Dominance   Dominant Hand: Right    Extremity/Trunk Assessment   Upper Extremity Assessment Upper Extremity Assessment: Defer to OT evaluation    Lower Extremity Assessment Lower  Extremity Assessment: LLE deficits/detail LLE Deficits / Details: Mild decrease in ROM at hip secondary to pain    Cervical / Trunk Assessment Cervical / Trunk Assessment: Normal  Communication   Communication: No difficulties  Cognition Arousal/Alertness: Awake/alert Behavior During Therapy: WFL for tasks assessed/performed Overall Cognitive Status: No family/caregiver present to determine baseline cognitive functioning                                          General Comments      Exercises     Assessment/Plan    PT Assessment Patient needs continued PT services  PT Problem List Decreased strength;Decreased activity tolerance;Decreased balance;Decreased mobility;Decreased knowledge of use of DME;Pain       PT Treatment Interventions DME instruction;Gait training;Stair training;Therapeutic activities;Functional mobility training;Therapeutic exercise;Balance training;Patient/family education    PT Goals (Current goals can be found in the Care Plan section)  Acute Rehab PT Goals Patient Stated Goal: to feel better PT Goal Formulation: With patient Time For Goal Achievement: 06/01/22 Potential to Achieve Goals: Good    Frequency Min 3X/week     Co-evaluation               AM-PAC PT "6 Clicks" Mobility  Outcome Measure Help needed turning from your back to your side while in a flat bed without using bedrails?: A Little Help needed moving from lying on your back to sitting on the side of a flat bed without using bedrails?: A Little Help needed moving to and from a bed to a chair (including a wheelchair)?: A Little Help needed standing up from a chair using your arms (e.g., wheelchair or bedside chair)?: A Little Help needed to walk in hospital room?: A Little Help needed climbing 3-5 steps with a railing? : A Little 6 Click Score: 18    End of Session Equipment Utilized During Treatment: Gait belt Activity Tolerance: Patient tolerated treatment  well Patient left: in bed;with call bell/phone within reach (on bed in ED) Nurse Communication: Mobility status PT Visit Diagnosis: Other abnormalities of gait and mobility (R26.89);Pain Pain - Right/Left: Left Pain - part of body:  (pelvis)    Time: 6270-3500 PT Time Calculation (min) (ACUTE ONLY): 11 min   Charges:   PT Evaluation $PT Eval Low Complexity: 1 Low          Farley Ly, PT, DPT  Acute Rehabilitation Services  Office: [phone]   [facility] Prom 05/18/2022, 2:30 PM

## 2022-05-18 NOTE — H&P (Signed)
History and Physical    Patient: Steven Arnold:[id number] DOB: 08-07-1945 DOA: 05/17/2022 DOS: the patient was seen and examined on 05/18/2022 PCP: Ladell Pier, MD  Patient coming from: Homeless  Chief Complaint:  Chief Complaint  Patient presents with   Hip Pain   HPI: Steven Arnold is a 76 y.o. male with medical history significant of EtOH abuse, homelessness, HTN.  Pt presents to ED with c/o L hip pain.  Not sure if he fell or not but L hip is hurting.  Drank EtOH yesterday.  No CP, SOB, cough, fever.  Few episodes of vomiting today.  No weakness in leg.  Review of Systems: As mentioned in the history of present illness. All other systems reviewed and are negative. Past Medical History:  Diagnosis Date   ETOH abuse    Homeless    HTN (hypertension)    Past Surgical History:  Procedure Laterality Date   NO PAST SURGERIES     Social History:  reports that he has been smoking. He has been smoking an average of .5 packs per day. His smokeless tobacco use includes snuff. He reports current alcohol use of about 6.0 standard drinks of alcohol per week. He reports that he does not use drugs.  No Known Allergies  No family history on file.  Prior to Admission medications   Medication Sig Start Date End Date Taking? Authorizing Provider  tamsulosin (FLOMAX) 0.4 MG CAPS capsule Take 1 capsule (0.4 mg total) by mouth daily. Patient not taking: Reported on 05/17/2022 08/27/20   Sidney Ace    Physical Exam: Vitals:   05/17/22 2302 05/17/22 2315 05/17/22 2330 05/18/22 0000  BP:  (!) 168/87 (!) 154/91 (!) 164/101  Pulse:  (!) 103 (!) 104 (!) 108  Resp:  (!) 22 20 (!) 23  Temp: 99.9 F (37.7 C)     TempSrc: Rectal     SpO2:  91% 90% 91%   Constitutional: NAD, calm, comfortable Eyes: PERRL, lids and conjunctivae normal ENMT: Mucous membranes are moist. Posterior pharynx clear of any exudate or lesions.Normal dentition.  Neck: normal, supple, no  masses, no thyromegaly Respiratory: clear to auscultation bilaterally, no wheezing, no crackles. Normal respiratory effort. No accessory muscle use.  Cardiovascular: Regular rate and rhythm, no murmurs / rubs / gallops. No extremity edema. 2+ pedal pulses. No carotid bruits.  Abdomen: no tenderness, no masses palpated. No hepatosplenomegaly. Bowel sounds positive.  Musculoskeletal: L hip TTP Skin: no rashes, lesions, ulcers. No induration Neurologic: CN 2-12 grossly intact. Sensation intact, DTR normal. Strength 5/5 in all 4.  Psychiatric: Normal judgment and insight. Alert and oriented x 3. Normal mood.   Data Reviewed:    L superior and inferior pubic rami fx on CT scan     Latest Ref Rng & Units 05/17/2022    5:40 PM 05/13/2022    8:21 AM 08/03/2019   10:58 PM  CBC  WBC 4.0 - 10.5 K/uL 5.9  4.1  4.6   Hemoglobin 13.0 - 17.0 g/dL 11.1  11.7  11.2   Hematocrit 39.0 - 52.0 % 31.5  35.3  33.8   Platelets 150 - 400 K/uL 208  231  254       Latest Ref Rng & Units 05/17/2022    5:40 PM 05/13/2022    8:21 AM 08/03/2019   10:58 PM  CMP  Glucose 70 - 99 mg/dL 89  88  116   BUN 8 - 23 mg/dL 24  8  20   Creatinine 0.61 - 1.24 mg/dL 0.99  0.80  1.15   Sodium 135 - 145 mmol/L 134  140  138   Potassium 3.5 - 5.1 mmol/L 4.6  4.5  3.3   Chloride 98 - 111 mmol/L 94  107  101   CO2 22 - 32 mmol/L 24  24  25    Calcium 8.9 - 10.3 mg/dL 9.4  9.3  9.3   Total Protein 6.5 - 8.1 g/dL 8.1  8.9  8.5   Total Bilirubin 0.3 - 1.2 mg/dL 0.8  0.8  0.5   Alkaline Phos 38 - 126 U/L 62  65  48   AST 15 - 41 U/L 38  27  23   ALT 0 - 44 U/L 19  14  20       Assessment and Plan: * Fracture of multiple pubic rami, left, closed, initial encounter ([place]) NWB for moment. EDP spoke with [facility], ortho will see in AM I presume this will most likely be non-operative as pubic rami fx typically are, will let pt eat and put on lovenox DVT ppx PT/OT SW consult Percocet PRN pain  Alcohol use disorder,  moderate, dependence ([place]) CIWA Suspect dehydration causing lactic acidosis today, getting repeat lactate after 2L IVF bolus.  Homeless SW consult      Advance Care Planning:   Code Status: Full Code  Consults: Dr. Doreatha Martin  Family Communication: No family in room  Severity of Illness: The appropriate patient status for this patient is OBSERVATION. Observation status is judged to be reasonable and necessary in order to provide the required intensity of service to ensure the patient's safety. The patient's presenting symptoms, physical exam findings, and initial radiographic and laboratory data in the context of their medical condition is felt to place them at decreased risk for further clinical deterioration. Furthermore, it is anticipated that the patient will be medically stable for discharge from the hospital within 2 midnights of admission.   Author: Etta Quill., DO 05/18/2022 1:15 AM  For on call review [email or web address].

## 2022-05-18 NOTE — ED Notes (Signed)
Pt enjoying breakfast at this time. No needs expressed. Stated hip is feeling better today.

## 2022-05-18 NOTE — ED Notes (Signed)
Alcario Drought DO aware of pts lactic 2.4

## 2022-05-18 NOTE — Evaluation (Signed)
Occupational Therapy Evaluation Patient Details Name: Steven Arnold MRN: [id number] DOB: 08-23-1945 Today's Date: 05/18/2022   History of Present Illness Pt is a 76 y/o male who presented with L hip pain. CT showed multiple fx of L pubic rami - nonoperative mgmt per ortho. PMH: alcohol use disorder, HTN, homelessness   Clinical Impression   PTA, pt homeless, independent in all daily tasks and community mobility without AD. Pt presents now with minor deficits in pain, relying on RW to offload painful LLE. Overall, pt moving well with no more than min guard needed for LB ADLs and mobility. Anticipate no OT needs at DC but will follow acutely to progress endurance and independence in all daily tasks.     Recommendations for follow up therapy are one component of a multi-disciplinary discharge planning process, led by the attending physician.  Recommendations may be updated based on patient status, additional functional criteria and insurance authorization.   Follow Up Recommendations  No OT follow up    Assistance Recommended at Discharge PRN  Patient can return home with the following      Functional Status Assessment  Patient has had a recent decline in their functional status and demonstrates the ability to make significant improvements in function in a reasonable and predictable amount of time.  Equipment Recommendations  Other (comment) (RW)    Recommendations for Other Services       Precautions / Restrictions Precautions Precautions: Fall Restrictions Weight Bearing Restrictions: Yes LLE Weight Bearing: Weight bearing as tolerated      Mobility Bed Mobility Overal bed mobility: Needs Assistance Bed Mobility: Supine to Sit, Sit to Supine     Supine to sit: Min guard Sit to supine: Min guard   General bed mobility comments: guarding of LLE and cues to get BLE close to minimize pain. increased time, but good carryover    Transfers Overall transfer level: Needs  assistance Equipment used: Rolling walker (2 wheels) Transfers: Sit to/from Stand Sit to Stand: Min guard                  Balance Overall balance assessment: Needs assistance Sitting-balance support: No upper extremity supported, Feet supported Sitting balance-Leahy Scale: Good     Standing balance support: Bilateral upper extremity supported, During functional activity Standing balance-Leahy Scale: Fair Standing balance comment: benefits from RW to offload painful LE                           ADL either performed or assessed with clinical judgement   ADL Overall ADL's : Needs assistance/impaired Eating/Feeding: Independent   Grooming: Set up;Standing   Upper Body Bathing: Set up   Lower Body Bathing: Min guard   Upper Body Dressing : Set up   Lower Body Dressing: Min guard   Toilet Transfer: Ambulation;Rolling walker (2 wheels);Min guard   Toileting- Clothing Manipulation and Hygiene: Min guard;Sit to/from stand;Sitting/lateral lean       Functional mobility during ADLs: Min guard;Rolling walker (2 wheels) General ADL Comments: Pt with minor deficits d/t pain, reliant on RW to offload painful LE though mobilizing fairly well and managing LB ADLs surprisingly well w/ current fx     Vision Ability to See in Adequate Light: 0 Adequate Patient Visual Report: No change from baseline Vision Assessment?: No apparent visual deficits     Perception     Praxis      Pertinent Vitals/Pain Pain Assessment Pain Assessment: Faces Faces Pain  Scale: Hurts a little bit Pain Location: L LE Pain Descriptors / Indicators: Grimacing Pain Intervention(s): Monitored during session     Hand Dominance Right   Extremity/Trunk Assessment Upper Extremity Assessment Upper Extremity Assessment: Overall WFL for tasks assessed   Lower Extremity Assessment Lower Extremity Assessment: Defer to PT evaluation   Cervical / Trunk Assessment Cervical / Trunk  Assessment: Normal   Communication Communication Communication: No difficulties   Cognition Arousal/Alertness: Awake/alert Behavior During Therapy: WFL for tasks assessed/performed Overall Cognitive Status: Within Functional Limits for tasks assessed                                       General Comments       Exercises     Shoulder Instructions      Home Living Family/patient expects to be discharged to:: Shelter/Homeless                                 Additional Comments: pt reports he may have some family/friends he could stay with      Prior Functioning/Environment Prior Level of Function : Independent/Modified Independent             Mobility Comments: ambulatory without AD, uses the bus in the community for longer distances prn          OT Problem List: Pain;Decreased knowledge of use of DME or AE      OT Treatment/Interventions: Self-care/ADL training;Therapeutic exercise;Energy conservation;DME and/or AE instruction;Therapeutic activities;Patient/family education    OT Goals(Current goals can be found in the care plan section) Acute Rehab OT Goals Patient Stated Goal: adequate pain control OT Goal Formulation: With patient Time For Goal Achievement: 06/01/22 Potential to Achieve Goals: Good  OT Frequency: Min 2X/week    Co-evaluation              AM-PAC OT "6 Clicks" Daily Activity     Outcome Measure Help from another person eating meals?: None Help from another person taking care of personal grooming?: A Little Help from another person toileting, which includes using toliet, bedpan, or urinal?: A Little Help from another person bathing (including washing, rinsing, drying)?: A Little Help from another person to put on and taking off regular upper body clothing?: None Help from another person to put on and taking off regular lower body clothing?: A Little 6 Click Score: 20   End of Session Equipment Utilized  During Treatment: Rolling walker (2 wheels);Gait belt Nurse Communication: Mobility status  Activity Tolerance: Patient tolerated treatment well Patient left: in bed  OT Visit Diagnosis: Other abnormalities of gait and mobility (R26.89);Pain Pain - Right/Left: Left Pain - part of body: Leg                Time: [id number] OT Time Calculation (min): 11 min Charges:  OT General Charges $OT Visit: 1 Visit OT Evaluation $OT Eval Low Complexity: 1 Low  Malachy Chamber, OTR/L Acute Rehab Services Office: [phone]   Layla Maw 05/18/2022, 11:23 AM

## 2022-05-18 NOTE — Consult Note (Signed)
Reason for Consult:Pelvic fxs Referring Physician: Wendee Beavers Time called: [id number] Time at bedside: 0909   Steven Arnold is an 76 y.o. male.  HPI: Mahdi presented to the ED yesterday c/o left hip pain. He doesn't remember falling but had been drinking. Workup showed left pubic rami fxs and orthopedic surgery was consulted. He is homeless and does not use any assistive devices to ambulate.  Past Medical History:  Diagnosis Date   ETOH abuse    Homeless    HTN (hypertension)     Past Surgical History:  Procedure Laterality Date   NO PAST SURGERIES      No family history on file.  Social History:  reports that he has been smoking. He has been smoking an average of .5 packs per day. His smokeless tobacco use includes snuff. He reports current alcohol use of about 6.0 standard drinks of alcohol per week. He reports that he does not use drugs.  Allergies: No Known Allergies  Medications: I have reviewed the patient's current medications.  Results for orders placed or performed during the hospital encounter of 05/17/22 (from the past 48 hour(s))  CBC with Differential     Status: Abnormal   Collection Time: 05/17/22  5:40 PM  Result Value Ref Range   WBC 5.9 4.0 - 10.5 K/uL   RBC 3.22 (L) 4.22 - 5.81 MIL/uL   Hemoglobin 11.1 (L) 13.0 - 17.0 g/dL   HCT 31.5 (L) 39.0 - 52.0 %   MCV 97.8 80.0 - 100.0 fL   MCH 34.5 (H) 26.0 - 34.0 pg   MCHC 35.2 30.0 - 36.0 g/dL   RDW 12.4 11.5 - 15.5 %   Platelets 208 150 - 400 K/uL   nRBC 0.0 0.0 - 0.2 %   Neutrophils Relative % 81 %   Neutro Abs 4.8 1.7 - 7.7 K/uL   Lymphocytes Relative 11 %   Lymphs Abs 0.6 (L) 0.7 - 4.0 K/uL   Monocytes Relative 8 %   Monocytes Absolute 0.4 0.1 - 1.0 K/uL   Eosinophils Relative 0 %   Eosinophils Absolute 0.0 0.0 - 0.5 K/uL   Basophils Relative 0 %   Basophils Absolute 0.0 0.0 - 0.1 K/uL   Immature Granulocytes 0 %   Abs Immature Granulocytes 0.02 0.00 - 0.07 K/uL    Comment: Performed at [facility], [address] [place], [place] [address]  Comprehensive metabolic panel     Status: Abnormal   Collection Time: 05/17/22  5:40 PM  Result Value Ref Range   Sodium 134 (L) 135 - 145 mmol/L   Potassium 4.6 3.5 - 5.1 mmol/L   Chloride 94 (L) 98 - 111 mmol/L   CO2 24 22 - 32 mmol/L   Glucose, Bld 89 70 - 99 mg/dL    Comment: Glucose reference range applies only to samples taken after fasting for at least 8 hours.   BUN 24 (H) 8 - 23 mg/dL   Creatinine, Ser 0.99 0.61 - 1.24 mg/dL   Calcium 9.4 8.9 - 10.3 mg/dL   Total Protein 8.1 6.5 - 8.1 g/dL   Albumin 4.0 3.5 - 5.0 g/dL   AST 38 15 - 41 U/L   ALT 19 0 - 44 U/L   Alkaline Phosphatase 62 38 - 126 U/L   Total Bilirubin 0.8 0.3 - 1.2 mg/dL   GFR, Estimated >60 >60 mL/min    Comment: (NOTE) Calculated using the CKD-EPI Creatinine Equation (2021)    Anion gap 16 (  H) 5 - 15    Comment: Performed at [facility], [place] [address], [place], [place] [address]  Lipase, blood     Status: None   Collection Time: 05/17/22  5:40 PM  Result Value Ref Range   Lipase 45 11 - 51 U/L    Comment: Performed at [place] [address] [place], [place] [address]  Lactic acid, plasma     Status: Abnormal   Collection Time: 05/17/22  5:41 PM  Result Value Ref Range   Lactic Acid, Venous 4.9 (HH) 0.5 - 1.9 mmol/L    Comment: CRITICAL RESULT CALLED TO, READ BACK BY AND VERIFIED WITH J,GLOSTON RN @1910  10/105/23 E,BENTON Performed at [facility], [place] [address] [place], [place] [address]   Culture, blood (routine x 2)     Status: None (Preliminary result)   Collection Time: 05/17/22  8:59 PM   Specimen: BLOOD RIGHT FOREARM  Result Value Ref Range   Specimen Description BLOOD RIGHT FOREARM    Special Requests      BOTTLES DRAWN AEROBIC AND ANAEROBIC Blood Culture adequate volume   Culture      NO GROWTH < 12 HOURS Performed at [facility], [place] [address], [place], [place] [address]    Report Status PENDING    Urinalysis, Routine w reflex microscopic Urine, Clean Catch     Status: Abnormal   Collection Time: 05/17/22  9:26 PM  Result Value Ref Range   Color, Urine YELLOW YELLOW   APPearance HAZY (A) CLEAR   Specific Gravity, Urine 1.015 1.005 - 1.030   pH 5.0 5.0 - 8.0   Glucose, UA NEGATIVE NEGATIVE mg/dL   Hgb urine dipstick NEGATIVE NEGATIVE   Bilirubin Urine NEGATIVE NEGATIVE   Ketones, ur 5 (A) NEGATIVE mg/dL   Protein, ur 30 (A) NEGATIVE mg/dL   Nitrite NEGATIVE NEGATIVE   Leukocytes,Ua NEGATIVE NEGATIVE   RBC / HPF 0-5 0 - 5 RBC/hpf   WBC, UA 0-5 0 - 5 WBC/hpf   Bacteria, UA NONE SEEN NONE SEEN   Mucus PRESENT    Hyaline Casts, UA PRESENT     Comment: Performed at [place] [address] [place], [place] [address]  Culture, blood (routine x 2)     Status: None (Preliminary result)   Collection Time: 05/17/22  9:26 PM   Specimen: BLOOD LEFT FOREARM  Result Value Ref Range   Specimen Description BLOOD LEFT FOREARM    Special Requests      BOTTLES DRAWN AEROBIC AND ANAEROBIC Blood Culture adequate volume   Culture      NO GROWTH < 12 HOURS Performed at [facility], [place] [address] [place],  [address]    Report Status PENDING   Urine rapid drug screen (hosp performed)     Status: Abnormal   Collection Time: 05/17/22  9:26 PM  Result Value Ref Range   Opiates NONE DETECTED NONE DETECTED   Cocaine NONE DETECTED NONE DETECTED   Benzodiazepines NONE DETECTED NONE DETECTED   Amphetamines NONE DETECTED NONE DETECTED   Tetrahydrocannabinol POSITIVE (A) NONE DETECTED   Barbiturates NONE DETECTED NONE DETECTED    Comment: (NOTE) DRUG SCREEN FOR MEDICAL PURPOSES ONLY.  IF CONFIRMATION IS NEEDED FOR ANY PURPOSE, NOTIFY LAB WITHIN 5 DAYS.  LOWEST DETECTABLE LIMITS FOR URINE DRUG SCREEN Drug Class  Cutoff (ng/mL) Amphetamine and metabolites    1000 Barbiturate and metabolites    200 Benzodiazepine                 200 Opiates and  metabolites        300 Cocaine and metabolites        300 THC                            50 Performed at [facility], [address], [place], [place] [address]   Resp Panel by RT-PCR (Flu A&B, Covid) Anterior Nasal Swab     Status: None   Collection Time: 05/17/22  9:41 PM   Specimen: Anterior Nasal Swab  Result Value Ref Range   SARS Coronavirus 2 by RT PCR NEGATIVE NEGATIVE    Comment: (NOTE) SARS-CoV-2 target nucleic acids are NOT DETECTED.  The SARS-CoV-2 RNA is generally detectable in upper respiratory specimens during the acute phase of infection. The lowest concentration of SARS-CoV-2 viral copies this assay can detect is 138 copies/mL. A negative result does not preclude SARS-Cov-2 infection and should not be used as the sole basis for treatment or other patient management decisions. A negative result may occur with  improper specimen collection/handling, submission of specimen other than nasopharyngeal swab, presence of viral mutation(s) within the areas targeted by this assay, and inadequate number of viral copies(<138 copies/mL). A negative result must be combined with clinical observations, patient history, and epidemiological information. The expected result is Negative.  Fact Sheet for Patients:  BloggerCourse.com  Fact Sheet for Healthcare Providers:  SeriousBroker.it  This test is no t yet approved or cleared by the [place] FDA and  has been authorized for detection and/or diagnosis of SARS-CoV-2 by FDA under an Emergency Use Authorization (EUA). This EUA will remain  in effect (meaning this test can be used) for the duration of the COVID-19 declaration under Section 564(b)(1) of the Act, 21 U.S.C.section 360bbb-3(b)(1), unless the authorization is terminated  or revoked sooner.       Influenza A by PCR NEGATIVE NEGATIVE   Influenza B by PCR NEGATIVE NEGATIVE    Comment: (NOTE) The Xpert  Xpress SARS-CoV-2/FLU/RSV plus assay is intended as an aid in the diagnosis of influenza from Nasopharyngeal swab specimens and should not be used as a sole basis for treatment. Nasal washings and aspirates are unacceptable for Xpert Xpress SARS-CoV-2/FLU/RSV testing.  Fact Sheet for Patients: BloggerCourse.com  Fact Sheet for Healthcare Providers: SeriousBroker.it  This test is not yet approved or cleared by the [place] FDA and has been authorized for detection and/or diagnosis of SARS-CoV-2 by FDA under an Emergency Use Authorization (EUA). This EUA will remain in effect (meaning this test can be used) for the duration of the COVID-19 declaration under Section 564(b)(1) of the Act, 21 U.S.C. section 360bbb-3(b)(1), unless the authorization is terminated or revoked.  Performed at [facility], [address], [place], [place] [address]   Lactic acid, plasma     Status: Abnormal   Collection Time: 05/17/22 11:02 PM  Result Value Ref Range   Lactic Acid, Venous 2.4 (HH) 0.5 - 1.9 mmol/L    Comment: CRITICAL RESULT CALLED TO, READ BACK BY AND VERIFIED WITH Allegra Grana, RN, 2357, 05/17/22, EADEDOKUN Performed at [facility], [address] [place], [place] [address]   Ethanol     Status: None   Collection Time: 05/17/22 11:02 PM  Result Value Ref Range   Alcohol, Ethyl (B) <10 <10 mg/dL    Comment: (NOTE) Lowest detectable limit for serum alcohol is 10 mg/dL.  For medical purposes only. Performed at [facility], [place] [address], [place], [place] [address]   CBC     Status: Abnormal   Collection Time: 05/18/22  4:40 AM  Result Value Ref Range   WBC 5.7 4.0 - 10.5 K/uL   RBC 2.46 (L) 4.22 - 5.81 MIL/uL   Hemoglobin 8.5 (L) 13.0 - 17.0 g/dL   HCT 24.1 (L) 39.0 - 52.0 %   MCV 98.0 80.0 - 100.0 fL   MCH 34.6 (H) 26.0 - 34.0 pg   MCHC 35.3 30.0 - 36.0 g/dL   RDW 12.1 11.5 - 15.5 %   Platelets 163 150  - 400 K/uL   nRBC 0.0 0.0 - 0.2 %    Comment: Performed at [facility], [place] [address], [place], [place] [address]  Basic metabolic panel     Status: Abnormal   Collection Time: 05/18/22  4:40 AM  Result Value Ref Range   Sodium 133 (L) 135 - 145 mmol/L   Potassium 3.3 (L) 3.5 - 5.1 mmol/L   Chloride 95 (L) 98 - 111 mmol/L   CO2 25 22 - 32 mmol/L   Glucose, Bld 85 70 - 99 mg/dL    Comment: Glucose reference range applies only to samples taken after fasting for at least 8 hours.   BUN 19 8 - 23 mg/dL   Creatinine, Ser 1.03 0.61 - 1.24 mg/dL   Calcium 8.6 (L) 8.9 - 10.3 mg/dL   GFR, Estimated >60 >60 mL/min    Comment: (NOTE) Calculated using the CKD-EPI Creatinine Equation (2021)    Anion gap 13 5 - 15    Comment: Performed at [place] [address], [place], [place] [address]  Magnesium     Status: Abnormal   Collection Time: 05/18/22  4:40 AM  Result Value Ref Range   Magnesium 1.3 (L) 1.7 - 2.4 mg/dL    Comment: Performed at [facility] [address] [place], [place] [address]    CT ABDOMEN PELVIS W CONTRAST  Addendum Date: 05/17/2022   ADDENDUM REPORT: 05/17/2022 22:46 ADDENDUM: After discussing the case with the emergency physician, fractures are noted within the left superior and inferior pubic rami. Electronically Signed   By: Rolm Baptise M.D.   On: 05/17/2022 22:46   Result Date: 05/17/2022 CLINICAL DATA:  Nausea, vomiting EXAM: CT ABDOMEN AND PELVIS WITH CONTRAST TECHNIQUE: Multidetector CT imaging of the abdomen and pelvis was performed using the standard protocol following bolus administration of intravenous contrast. RADIATION DOSE REDUCTION: This exam was performed according to the departmental dose-optimization program which includes automated exposure control, adjustment of the mA and/or kV according to patient size and/or use of iterative reconstruction technique. CONTRAST:  6mL OMNIPAQUE IOHEXOL 350 MG/ML SOLN COMPARISON:  None  Available. FINDINGS: Lower chest: Coronary artery and aortic calcifications. There is indistinctness of the distal esophagus which appears thick walled. This is concerning for esophagitis. No effusions. Hepatobiliary: Layering gallstones within the gallbladder. No biliary ductal dilatation or focal hepatic abnormality. Pancreas: No focal abnormality or ductal dilatation. Spleen: No focal abnormality.  Normal size.  New Adrenals/Urinary Tract: Adrenal glands unremarkable. Bilateral renal cysts, the largest in the right midpole measuring 3.6 cm. No follow-up imaging recommended. No renal or ureteral stones. No hydronephrosis. Urinary bladder unremarkable. Stomach/Bowel: Normal appendix. Stomach,  large and small bowel grossly unremarkable. Vascular/Lymphatic: Heavily calcified aorta and iliac vessels. No evidence of aneurysm or adenopathy. Reproductive: Prostate enlargement Other: No free fluid or free air. Musculoskeletal: No acute bony abnormality. IMPRESSION: Thickened, indistinct distal esophagus suggesting esophagitis. Coronary artery disease, aortic atherosclerosis. Cholelithiasis. Prostate enlargement. Electronically Signed: By: Rolm Baptise M.D. On: 05/17/2022 21:57   DG Hip Unilat W or Wo Pelvis 2-3 Views Left  Result Date: 05/17/2022 CLINICAL DATA:  Left pain, tachycardia. EXAM: DG HIP (WITH OR WITHOUT PELVIS) 2-3V LEFT COMPARISON:  None Available. FINDINGS: There is cortical irregularity of the superior pubic ramus suggesting acute fracture. There is also cortical irregularity of the inferior pubic ramus also suspicious for a mildly displaced fracture. Mild left hip osteoarthritis. Prominent vascular calcifications. IMPRESSION: Mildly displaced fracture of the left superior pubic ramus. Findings suspicious for mildly displaced fracture of the left inferior pubic ramus. Electronically Signed   By: Keane Police D.O.   On: 05/17/2022 21:11   DG Chest Portable 1 View  Result Date: 05/17/2022 CLINICAL  DATA:  Left hip pain, tachycardia EXAM: PORTABLE CHEST 1 VIEW COMPARISON:  05/13/2022 FINDINGS: The heart size and mediastinal contours are within normal limits. Both lungs are clear. The visualized skeletal structures are unremarkable. IMPRESSION: No acute abnormality of the lungs in AP portable projection. Electronically Signed   By: Delanna Ahmadi M.D.   On: 05/17/2022 21:09    Review of Systems  HENT:  Negative for ear discharge, ear pain, hearing loss and tinnitus.   Eyes:  Negative for photophobia and pain.  Respiratory:  Negative for cough and shortness of breath.   Cardiovascular:  Negative for chest pain.  Gastrointestinal:  Negative for abdominal pain, nausea and vomiting.  Genitourinary:  Negative for dysuria, flank pain, frequency and urgency.  Musculoskeletal:  Positive for arthralgias (Pelvis). Negative for back pain, myalgias and neck pain.  Neurological:  Negative for dizziness and headaches.  Hematological:  Does not bruise/bleed easily.  Psychiatric/Behavioral:  The patient is not nervous/anxious.    Blood pressure (!) 152/75, pulse 95, temperature 99.1 F (37.3 C), temperature source Oral, resp. rate 16, SpO2 96 %. Physical Exam Constitutional:      General: He is not in acute distress.    Appearance: He is well-developed. He is not diaphoretic.  HENT:     Head: Normocephalic and atraumatic.  Eyes:     General: No scleral icterus.       Right eye: No discharge.        Left eye: No discharge.     Conjunctiva/sclera: Conjunctivae normal.  Cardiovascular:     Rate and Rhythm: Normal rate and regular rhythm.  Pulmonary:     Effort: Pulmonary effort is normal. No respiratory distress.  Musculoskeletal:     Cervical back: Normal range of motion.     Comments: LLE No traumatic wounds, ecchymosis, or rash  Nontender  No knee or ankle effusion  Knee stable to varus/ valgus and anterior/posterior stress  Sens DPN, SPN, TN intact  Motor EHL, ext, flex, evers 5/5  DP 2+,  PT 1+, No significant edema  Skin:    General: Skin is warm and dry.  Neurological:     Mental Status: He is alert.  Psychiatric:        Mood and Affect: Mood normal.        Behavior: Behavior normal.     Assessment/Plan: Pelvic fxs -- Pt may be WBAT LLE. F/u with Dr. Doreatha Martin in 3 weeks.  Lisette Abu, PA-C Orthopedic Surgery [phone] 05/18/2022, 9:22 AM

## 2022-05-18 NOTE — ED Notes (Signed)
Pt asleep in room. NAD. 

## 2022-05-19 ENCOUNTER — Other Ambulatory Visit (HOSPITAL_COMMUNITY): Payer: Self-pay

## 2022-05-19 DIAGNOSIS — F1020 Alcohol dependence, uncomplicated: Secondary | ICD-10-CM | POA: Diagnosis not present

## 2022-05-19 DIAGNOSIS — N400 Enlarged prostate without lower urinary tract symptoms: Secondary | ICD-10-CM | POA: Diagnosis not present

## 2022-05-19 DIAGNOSIS — S32592A Other specified fracture of left pubis, initial encounter for closed fracture: Secondary | ICD-10-CM | POA: Diagnosis not present

## 2022-05-19 DIAGNOSIS — E871 Hypo-osmolality and hyponatremia: Secondary | ICD-10-CM

## 2022-05-19 DIAGNOSIS — D649 Anemia, unspecified: Secondary | ICD-10-CM

## 2022-05-19 DIAGNOSIS — S32502A Unspecified fracture of left pubis, initial encounter for closed fracture: Secondary | ICD-10-CM | POA: Diagnosis not present

## 2022-05-19 DIAGNOSIS — Z5900 Homelessness unspecified: Secondary | ICD-10-CM | POA: Diagnosis not present

## 2022-05-19 LAB — CBC
HCT: 23.4 % — ABNORMAL LOW (ref 39.0–52.0)
Hemoglobin: 8.4 g/dL — ABNORMAL LOW (ref 13.0–17.0)
MCH: 35 pg — ABNORMAL HIGH (ref 26.0–34.0)
MCHC: 35.9 g/dL (ref 30.0–36.0)
MCV: 97.5 fL (ref 80.0–100.0)
Platelets: 148 10*3/uL — ABNORMAL LOW (ref 150–400)
RBC: 2.4 MIL/uL — ABNORMAL LOW (ref 4.22–5.81)
RDW: 12.2 % (ref 11.5–15.5)
WBC: 5.7 10*3/uL (ref 4.0–10.5)
nRBC: 0 % (ref 0.0–0.2)

## 2022-05-19 LAB — RENAL FUNCTION PANEL
Albumin: 3 g/dL — ABNORMAL LOW (ref 3.5–5.0)
Anion gap: 6 (ref 5–15)
BUN: 15 mg/dL (ref 8–23)
CO2: 26 mmol/L (ref 22–32)
Calcium: 8.7 mg/dL — ABNORMAL LOW (ref 8.9–10.3)
Chloride: 100 mmol/L (ref 98–111)
Creatinine, Ser: 0.93 mg/dL (ref 0.61–1.24)
GFR, Estimated: >60 mL/min (ref >60)
Glucose, Bld: 109 mg/dL — ABNORMAL HIGH (ref 70–99)
Phosphorus: 2.3 mg/dL — ABNORMAL LOW (ref 2.5–4.6)
Potassium: 3.8 mmol/L (ref 3.5–5.1)
Sodium: 132 mmol/L — ABNORMAL LOW (ref 135–145)

## 2022-05-19 LAB — CK: Total CK: 264 U/L (ref 49–397)

## 2022-05-19 LAB — MAGNESIUM: Magnesium: 2 mg/dL (ref 1.7–2.4)

## 2022-05-19 LAB — LACTIC ACID, PLASMA: Lactic Acid, Venous: 0.8 mmol/L (ref 0.5–1.9)

## 2022-05-19 MED ORDER — TAMSULOSIN HCL 0.4 MG PO CAPS
0.4000 mg | ORAL_CAPSULE | Freq: Every day | ORAL | 1 refills | Status: AC
  Filled 2022-05-19: qty 90, 90d supply, fill #0

## 2022-05-19 MED ORDER — ACETAMINOPHEN 500 MG PO TABS: ORAL_TABLET | ORAL | 0 refills | Status: AC

## 2022-05-19 MED ORDER — ADULT MULTIVITAMIN W/MINERALS CH
1.0000 | ORAL_TABLET | Freq: Every day | ORAL | 1 refills | Status: AC
  Filled 2022-05-19: qty 30, 30d supply, fill #0

## 2022-05-19 MED ORDER — THIAMINE HCL 100 MG PO TABS
100.0000 mg | ORAL_TABLET | Freq: Every day | ORAL | 1 refills | Status: AC
  Filled 2022-05-19: qty 30, 30d supply, fill #0

## 2022-05-19 MED ORDER — SENNOSIDES-DOCUSATE SODIUM 8.6-50 MG PO TABS: 1.0000 | ORAL_TABLET | Freq: Two times a day (BID) | ORAL | 0 refills | Status: AC | PRN

## 2022-05-19 MED ORDER — FOLIC ACID 1 MG PO TABS
1.0000 mg | ORAL_TABLET | Freq: Every day | ORAL | 1 refills | Status: AC
  Filled 2022-05-19: qty 30, 30d supply, fill #0

## 2022-05-19 NOTE — Progress Notes (Signed)
Mobility Specialist: Progress Note   05/19/22 1115  Mobility  Activity Ambulated with assistance in hallway  Level of Assistance Minimal assist, patient does 75% or more  Assistive Device Front wheel walker  Distance Ambulated (ft) 350 ft  LLE Weight Bearing WBAT  Activity Response Tolerated well  Mobility Referral Yes  $Mobility charge 1 Mobility   Pt received in the bed and agreeable to mobility. MinA to assist LLE in/out of bed, otherwise contact guard. Observed L knee buckling x2 during ambulation, no overt LOB. Pt back to bed with call bell and phone in reach.   Garza Steven Arnold Mobility Specialist Secure Chat Only

## 2022-05-19 NOTE — Care Management Obs Status (Cosign Needed)
[facility] NOTIFICATION   Patient Details  Name: LEMARIO CHAIKIN MRN: [id number] Date of Birth: 02/20/1946   Medicare Observation Status Notification Given:  Yes    Curlene Labrum, RN 05/19/2022, 11:04 AM

## 2022-05-19 NOTE — Discharge Summary (Signed)
Physician Discharge Summary  Steven Arnold [id number] DOB: Apr 22, 1946 DOA: 05/17/2022  PCP: Ladell Pier, MD  Admit date: 05/17/2022 Discharge date: 05/19/2022 Admitted From: Homeless. Disposition: Friend's house Recommendations for Outpatient Follow-up:  Follow up with PCP in in 1 to 2 weeks Outpatient follow-up with orthopedic surgeon in 3 weeks Check CMP and CBC in 1 to 2 weeks Please follow up on the following pending results: None  Home Health: Not required. Equipment/Devices: Rolling walker  Discharge Condition: Stable CODE STATUS: Full code  Follow-up Information     Ladell Pier, MD. Schedule an appointment as soon as possible for a visit.   Specialty: Internal Medicine Why: Please call and schedule a follow up in the next week. Contact information: [address] [place] [address] [phone]         Shona Needles, MD. Schedule an appointment as soon as possible for a visit in 3 week(s).   Specialty: Orthopedic Surgery Contact information: [place] [address] [phone]         Care, [place] Follow up.   Why: [employer] will provide you with a rolling walker prior to your discharge to home today. Contact information: [address] [place] [id number] [phone]                 Hospital course 76 year old M with PMH of ongoing EtOH abuse, HTN, homelessness and tobacco use disorder presenting with left hip pain.  Patient is unsure of trauma or fall.  X-ray showed mildly displaced fracture of left superior and inferior pubic rami.  CT abdomen and pelvis with thickened distal distal esophagus suggesting esophagitis, cholelithiasis and enlarged prostate. Orthopedic surgery consulted, recommended WBAT and outpatient follow-up with Dr. Doreatha Martin in 3 weeks.  TOC consulted as well. Therapy recommended RW.  On the day of discharge, patient remained stable.  Pain fairly controlled.  He is  discharged on p.o. Tylenol every 8 hours for 5 days followed by every 8 hours as needed.  Patient to follow-up with orthopedic surgery in 3 weeks.  Encouraged to refrain from alcohol and tobacco.  Patient is homeless but he will be staying with his friend after discharge.  Prescriptions filled at [facility] prior to discharge.  See individual problem list below for more.   Problems addressed during this hospitalization Principal Problem:   Fracture of multiple pubic rami, left, closed, initial encounter [facility]) Active Problems:   Alcohol use disorder, moderate, dependence (HCC)   Homeless   Tobacco dependence   Hyponatremia   Essential hypertension   BPH (benign prostatic hyperplasia)   Normocytic anemia      Vital signs Vitals:   05/18/22 2315 05/19/22 0340 05/19/22 0750 05/19/22 1143  BP: 129/72 130/80 132/79 127/62  Pulse: 99 95 86 94  Temp: 99.7 F (37.6 C) 98 F (36.7 C) 98.9 F (37.2 C) 98.8 F (37.1 C)  Resp: 17 18 17 18   Height:      Weight:      SpO2: 93% 95% 94% 94%  TempSrc: Oral Oral Oral Oral  BMI (Calculated):         Discharge exam  GENERAL: No apparent distress.  Nontoxic. HEENT: MMM.  Vision and hearing grossly intact.  NECK: Supple.  No apparent JVD.  RESP:  No IWOB.  Fair aeration bilaterally. CVS:  RRR. Heart sounds normal.  ABD/GI/GU: BS+. Abd soft, NTND.  MSK/EXT:  Moves extremities. No apparent deformity. No edema.  SKIN:  no apparent skin lesion or wound NEURO: Awake and alert. Oriented appropriately.  No apparent focal neuro deficit. PSYCH: Calm. Normal affect.   Discharge Instructions Discharge Instructions     Diet - low sodium heart healthy   Complete by: As directed    Discharge instructions   Complete by: As directed    It has been a pleasure taking care of you!  You were hospitalized due to left pelvic fracture.  This does not require surgery.  You can bear weight on your left leg as able.  Continue taking Tylenol as needed for  pain.  We strongly recommend you refrain from drinking alcohol and smoking cigarettes.  Follow-up with orthopedic surgeon in 3 weeks.  It is important that you quit smoking cigarettes.  You may use nicotine patch to help you quit smoking.  Nicotine patch is available over-the-counter.  You may also discuss other options to help you quit smoking with your primary care doctor. You can also talk to professional counselors at 1-800-QUIT-NOW [phone]) for free smoking cessation counseling.     Take care,   Increase activity slowly   Complete by: As directed       Allergies as of 05/19/2022   No Known Allergies      Medication List     TAKE these medications    acetaminophen 500 MG tablet Commonly known as: TYLENOL Take 2 tablets (1,000 mg total) by mouth every 8 (eight) hours for 5 days, THEN 2 tablets (1,000 mg total) every 8 (eight) hours as needed for up to 5 days. Start taking on: October 17, 99991111   CertaVite/Antioxidants Tabs Take 1 tablet by mouth daily. Start taking on: October 18, 99991111   folic acid 1 MG tablet Commonly known as: FOLVITE Take 1 tablet (1 mg total) by mouth daily. Start taking on: May 20, 2022   senna-docusate 8.6-50 MG tablet Commonly known as: Senokot-S Take 1 tablet by mouth 2 (two) times daily between meals as needed for moderate constipation.   tamsulosin 0.4 MG Caps capsule Commonly known as: Flomax Take 1 capsule (0.4 mg total) by mouth daily.   thiamine 100 MG tablet Commonly known as: VITAMIN B1 Take 1 tablet (100 mg total) by mouth daily. Start taking on: May 20, 2022               Durable Medical Equipment  (From admission, onward)           Start     Ordered   05/19/22 0915  For home use only DME Walker rolling  Once       Question Answer Comment  Walker: With 5 Inch Wheels   Patient needs a walker to treat with the following condition Pelvic fracture ([place])      05/19/22 0914             Consultations: Orthopedic surgery  Procedures/Studies:   CT ABDOMEN PELVIS W CONTRAST  Addendum Date: 05/17/2022   ADDENDUM REPORT: 05/17/2022 22:46 ADDENDUM: After discussing the case with the emergency physician, fractures are noted within the left superior and inferior pubic rami. Electronically Signed   By: Rolm Baptise M.D.   On: 05/17/2022 22:46   Result Date: 05/17/2022 CLINICAL DATA:  Nausea, vomiting EXAM: CT ABDOMEN AND PELVIS WITH CONTRAST TECHNIQUE: Multidetector CT imaging of the abdomen and pelvis was performed using the standard protocol following bolus administration of intravenous contrast. RADIATION DOSE REDUCTION: This exam was performed according to the departmental dose-optimization program which includes automated exposure  control, adjustment of the mA and/or kV according to patient size and/or use of iterative reconstruction technique. CONTRAST:  20mL OMNIPAQUE IOHEXOL 350 MG/ML SOLN COMPARISON:  None Available. FINDINGS: Lower chest: Coronary artery and aortic calcifications. There is indistinctness of the distal esophagus which appears thick walled. This is concerning for esophagitis. No effusions. Hepatobiliary: Layering gallstones within the gallbladder. No biliary ductal dilatation or focal hepatic abnormality. Pancreas: No focal abnormality or ductal dilatation. Spleen: No focal abnormality.  Normal size.  New Adrenals/Urinary Tract: Adrenal glands unremarkable. Bilateral renal cysts, the largest in the right midpole measuring 3.6 cm. No follow-up imaging recommended. No renal or ureteral stones. No hydronephrosis. Urinary bladder unremarkable. Stomach/Bowel: Normal appendix. Stomach, large and small bowel grossly unremarkable. Vascular/Lymphatic: Heavily calcified aorta and iliac vessels. No evidence of aneurysm or adenopathy. Reproductive: Prostate enlargement Other: No free fluid or free air. Musculoskeletal: No acute bony abnormality. IMPRESSION: Thickened,  indistinct distal esophagus suggesting esophagitis. Coronary artery disease, aortic atherosclerosis. Cholelithiasis. Prostate enlargement. Electronically Signed: By: Rolm Baptise M.D. On: 05/17/2022 21:57   DG Hip Unilat W or Wo Pelvis 2-3 Views Left  Result Date: 05/17/2022 CLINICAL DATA:  Left pain, tachycardia. EXAM: DG HIP (WITH OR WITHOUT PELVIS) 2-3V LEFT COMPARISON:  None Available. FINDINGS: There is cortical irregularity of the superior pubic ramus suggesting acute fracture. There is also cortical irregularity of the inferior pubic ramus also suspicious for a mildly displaced fracture. Mild left hip osteoarthritis. Prominent vascular calcifications. IMPRESSION: Mildly displaced fracture of the left superior pubic ramus. Findings suspicious for mildly displaced fracture of the left inferior pubic ramus. Electronically Signed   By: Keane Police D.O.   On: 05/17/2022 21:11   DG Chest Portable 1 View  Result Date: 05/17/2022 CLINICAL DATA:  Left hip pain, tachycardia EXAM: PORTABLE CHEST 1 VIEW COMPARISON:  05/13/2022 FINDINGS: The heart size and mediastinal contours are within normal limits. Both lungs are clear. The visualized skeletal structures are unremarkable. IMPRESSION: No acute abnormality of the lungs in AP portable projection. Electronically Signed   By: Delanna Ahmadi M.D.   On: 05/17/2022 21:09   MR BRAIN WO CONTRAST  Result Date: 05/14/2022 CLINICAL DATA:  76 year old male with lightheadedness, palpitations, intermittent left side headache, left side blurred vision. EXAM: MRI HEAD WITHOUT CONTRAST TECHNIQUE: Multiplanar, multiecho pulse sequences of the brain and surrounding structures were obtained without intravenous contrast. COMPARISON:  Head CT yesterday and earlier. FINDINGS: Brain: No restricted diffusion to suggest acute infarction. No midline shift, mass effect, evidence of mass lesion, ventriculomegaly, extra-axial collection or acute intracranial hemorrhage.  Cervicomedullary junction and pituitary are within normal limits.Overall cerebral volume seems within normal limits for age. There is a chronic microhemorrhage in the central pons on series 14, image 16. But otherwise gray and white matter signal is within normal limits for age throughout the brain. No other chronic cerebral blood products. No cortical encephalomalacia identified. Deep gray nuclei and cerebellum appear negative. Vascular: Major intracranial vascular flow voids are preserved. Skull and upper cervical spine: Multilevel cervical spine disc and endplate degeneration. Borderline to mild spinal stenosis at C3-C4. Calvarium bone marrow signal is normal. Sinuses/Orbits: Negative orbits. Moderate right maxillary sinus mucosal thickening and opacification again noted. No sinus fluid levels. Other: Mastoids are clear. Grossly normal visible internal auditory structures. Visible scalp and face appear negative. IMPRESSION: 1. No acute intracranial abnormality. 2. Solitary chronic microhemorrhage in the central pons, nonspecific. Otherwise normal for age noncontrast MRI appearance of the brain. 3. Right maxillary sinus inflammation. 4.  Cervical spine degeneration with borderline or mild spinal stenosis at C3-C4. Electronically Signed   By: Genevie Ann M.D.   On: 05/14/2022 06:34   CT Head Wo Contrast  Result Date: 05/13/2022 CLINICAL DATA:  Head trauma EXAM: CT HEAD WITHOUT CONTRAST TECHNIQUE: Contiguous axial images were obtained from the base of the skull through the vertex without intravenous contrast. RADIATION DOSE REDUCTION: This exam was performed according to the departmental dose-optimization program which includes automated exposure control, adjustment of the mA and/or kV according to patient size and/or use of iterative reconstruction technique. COMPARISON:  CT head 08/03/2019 FINDINGS: Brain: There is no acute intracranial hemorrhage, extra-axial fluid collection, or acute infarct. Parenchymal volume  is normal for age. The ventricles are normal in size. Patchy hypodensity in the supratentorial white matter likely reflects sequela of underlying mild chronic small vessel ischemic change There is no mass lesion.  There is no mass effect or midline shift. Vascular: There is calcification of the bilateral carotid siphons. Skull: Normal. Negative for fracture or focal lesion. Sinuses/Orbits: There is marked mucosal thickening in the right maxillary sinus with surrounding hyperostosis. There is layering fluid. Globes and orbits are unremarkable. Other: None. IMPRESSION: 1. No acute intracranial pathology. 2. Chronic right maxillary sinusitis with layering fluid which may reflect superimposed acute sinusitis in the correct clinical setting. Electronically Signed   By: Valetta Mole M.D.   On: 05/13/2022 08:58   DG Chest 2 View  Result Date: 05/13/2022 CLINICAL DATA:  Tachycardia and palpitations. EXAM: CHEST - 2 VIEW COMPARISON:  None FINDINGS: Heart size and mediastinal contours are normal. There is no pleural effusion or edema. Lungs appear hyperinflated but clear. Visualized osseous structures are unremarkable. IMPRESSION: 1. No acute findings. 2. Hyperinflation. Electronically Signed   By: Kerby Moors M.D.   On: 05/13/2022 08:11       The results of significant diagnostics from this hospitalization (including imaging, microbiology, ancillary and laboratory) are listed below for reference.     Microbiology: Recent Results (from the past 240 hour(s))  Culture, blood (routine x 2)     Status: None (Preliminary result)   Collection Time: 05/17/22  8:59 PM   Specimen: BLOOD RIGHT FOREARM  Result Value Ref Range Status   Specimen Description BLOOD RIGHT FOREARM  Final   Special Requests   Final    BOTTLES DRAWN AEROBIC AND ANAEROBIC Blood Culture adequate volume   Culture   Final    NO GROWTH 2 DAYS Performed at [facility], [address], [place], [place] [address]    Report Status  PENDING  Incomplete  Culture, blood (routine x 2)     Status: None (Preliminary result)   Collection Time: 05/17/22  9:26 PM   Specimen: BLOOD LEFT FOREARM  Result Value Ref Range Status   Specimen Description BLOOD LEFT FOREARM  Final   Special Requests   Final    BOTTLES DRAWN AEROBIC AND ANAEROBIC Blood Culture adequate volume   Culture   Final    NO GROWTH 2 DAYS Performed at [facility], [place] [address], [place], [place] [address]    Report Status PENDING  Incomplete  Resp Panel by RT-PCR (Flu A&B, Covid) Anterior Nasal Swab     Status: None   Collection Time: 05/17/22  9:41 PM   Specimen: Anterior Nasal Swab  Result Value Ref Range Status   SARS Coronavirus 2 by RT PCR NEGATIVE NEGATIVE Final    Comment: (NOTE) SARS-CoV-2 target nucleic acids are  NOT DETECTED.  The SARS-CoV-2 RNA is generally detectable in upper respiratory specimens during the acute phase of infection. The lowest concentration of SARS-CoV-2 viral copies this assay can detect is 138 copies/mL. A negative result does not preclude SARS-Cov-2 infection and should not be used as the sole basis for treatment or other patient management decisions. A negative result may occur with  improper specimen collection/handling, submission of specimen other than nasopharyngeal swab, presence of viral mutation(s) within the areas targeted by this assay, and inadequate number of viral copies(<138 copies/mL). A negative result must be combined with clinical observations, patient history, and epidemiological information. The expected result is Negative.  Fact Sheet for Patients:  EntrepreneurPulse.com.au  Fact Sheet for Healthcare Providers:  IncredibleEmployment.be  This test is no t yet approved or cleared by the [place] FDA and  has been authorized for detection and/or diagnosis of SARS-CoV-2 by FDA under an Emergency Use Authorization (EUA). This EUA will remain  in  effect (meaning this test can be used) for the duration of the COVID-19 declaration under Section 564(b)(1) of the Act, 21 U.S.C.section 360bbb-3(b)(1), unless the authorization is terminated  or revoked sooner.       Influenza A by PCR NEGATIVE NEGATIVE Final   Influenza B by PCR NEGATIVE NEGATIVE Final    Comment: (NOTE) The Xpert Xpress SARS-CoV-2/FLU/RSV plus assay is intended as an aid in the diagnosis of influenza from Nasopharyngeal swab specimens and should not be used as a sole basis for treatment. Nasal washings and aspirates are unacceptable for Xpert Xpress SARS-CoV-2/FLU/RSV testing.  Fact Sheet for Patients: EntrepreneurPulse.com.au  Fact Sheet for Healthcare Providers: IncredibleEmployment.be  This test is not yet approved or cleared by the [place] FDA and has been authorized for detection and/or diagnosis of SARS-CoV-2 by FDA under an Emergency Use Authorization (EUA). This EUA will remain in effect (meaning this test can be used) for the duration of the COVID-19 declaration under Section 564(b)(1) of the Act, 21 U.S.C. section 360bbb-3(b)(1), unless the authorization is terminated or revoked.  Performed at [facility], [place] [address] [place],  [address]      Labs:  CBC: Recent Labs  Lab 05/13/22 3154 05/17/22 1740 05/18/22 0440 05/18/22 1133 05/19/22 0407  WBC 4.1 5.9 5.7 6.3 5.7  NEUTROABS 1.9 4.8  --   --   --   HGB 11.7* 11.1* 8.5* 8.7* 8.4*  HCT 35.3* 31.5* 24.1* 24.9* 23.4*  MCV 102.3* 97.8 98.0 100.8* 97.5  PLT 231 208 163 164 148*   BMP &GFR Recent Labs  Lab 05/13/22 0821 05/17/22 1740 05/18/22 0440 05/19/22 0407  NA 140 134* 133* 132*  K 4.5 4.6 3.3* 3.8  CL 107 94* 95* 100  CO2 24 24 25 26   GLUCOSE 88 89 85 109*  BUN 8 24* 19 15  CREATININE 0.80 0.99 1.03 0.93  CALCIUM 9.3 9.4 8.6* 8.7*  MG 1.6*  --  1.3* 2.0  PHOS  --   --   --  2.3*   Estimated Creatinine  Clearance: 59.9 mL/min (by C-G formula based on SCr of 0.93 mg/dL). Liver & Pancreas: Recent Labs  Lab 05/13/22 0821 05/17/22 1740 05/19/22 0407  AST 27 38  --   ALT 14 19  --   ALKPHOS 65 62  --   BILITOT 0.8 0.8  --   PROT 8.9* 8.1  --   ALBUMIN 4.2 4.0 3.0*   Recent Labs  Lab 05/17/22 1740  LIPASE 45  No results for input(s): "AMMONIA" in the last 168 hours. Diabetic: No results for input(s): "HGBA1C" in the last 72 hours. No results for input(s): "GLUCAP" in the last 168 hours. Cardiac Enzymes: Recent Labs  Lab 05/19/22 0407  CKTOTAL 264   No results for input(s): "PROBNP" in the last 8760 hours. Coagulation Profile: No results for input(s): "INR", "PROTIME" in the last 168 hours. Thyroid Function Tests: No results for input(s): "TSH", "T4TOTAL", "FREET4", "T3FREE", "THYROIDAB" in the last 72 hours. Lipid Profile: No results for input(s): "CHOL", "HDL", "LDLCALC", "TRIG", "CHOLHDL", "LDLDIRECT" in the last 72 hours. Anemia Panel: Recent Labs    05/18/22 1132 05/18/22 1133  VITAMINB12  --  536  FOLATE 12.1  --   FERRITIN  --  851*  TIBC  --  199*  IRON  --  56  RETICCTPCT  --  1.4   Urine analysis:    Component Value Date/Time   COLORURINE YELLOW 05/17/2022 2126   APPEARANCEUR HAZY (A) 05/17/2022 2126   LABSPEC 1.015 05/17/2022 2126   PHURINE 5.0 05/17/2022 2126   GLUCOSEU NEGATIVE 05/17/2022 2126   HGBUR NEGATIVE 05/17/2022 2126   BILIRUBINUR NEGATIVE 05/17/2022 2126   KETONESUR 5 (A) 05/17/2022 2126   PROTEINUR 30 (A) 05/17/2022 2126   NITRITE NEGATIVE 05/17/2022 2126   LEUKOCYTESUR NEGATIVE 05/17/2022 2126   Sepsis Labs: Invalid input(s): "PROCALCITONIN", "LACTICIDVEN"   SIGNED:  Mercy Riding, MD  [facility] 05/19/2022, 4:37 PM

## 2022-05-19 NOTE — TOC Transition Note (Signed)
Transition of Care [facility]) - CM/SW Discharge Note   Patient Details  Name: Steven Arnold MRN: [id number] Date of Birth: [birth year]  [facility] CM/SW Contact:  Curlene Labrum, RN Phone Number: 05/19/2022, 9:45 AM   Clinical Narrative:    CM met with the patient at the bedside for transitions of care needs.  The patient is homeless at this time and plans to stay with a friend in [place] or [place] after discharge from the hospital.  The patient was given 2 city bus passes to assist with transportation.  The patient states that ne plans to stay in [place] in a friend's apartment at this time but has the option to have his friend, Steven Arnold pick him up by car today.  I provided him with the number to call to coordinate a ride.  PCP was set up for the patient and noted in the discharge instructions.  CAGe Assessment completed since patient with active history of alcohol use.  Substance abuse resources provided to the patient.  DME - I called [employer] to order RW to be delivered to the hospital room.  Discharge medications to be provided through [facility].   Final next level of care: Home/Self Care Barriers to Discharge: No Barriers Identified   Patient Goals and CMS Choice Patient states their goals for this hospitalization and ongoing recovery are:: To get better and stay with friend CMS Medicare.gov Compare Post Acute Care list provided to:: Patient Choice offered to / list presented to : Patient  Discharge Placement                       Discharge Plan and Services   Discharge Planning Services: CM Consult Post Acute Care Choice: Durable Medical Equipment          DME Arranged: Gilford Rile DME Agency: [facility] Date DME Agency Contacted: 05/19/22 Time DME Agency Contacted: [phone] Representative spoke with at DME Agency: Brenton Grills, Shannon with [employer]            Social Determinants of Health ([place]) Interventions Housing Interventions: Other  (Comment) (Patient provided with Shelter resources - staying with friends at this time)   Readmission Risk Interventions     No data to display

## 2022-05-20 ENCOUNTER — Telehealth: Payer: Self-pay

## 2022-05-20 NOTE — Telephone Encounter (Signed)
Transition Care Management Unsuccessful Follow-up Telephone Call  Date of discharge and from where:  05/19/2022, [facility]  Attempts:  1st Attempt  Reason for unsuccessful TCM follow-up call:  Left voice message on [phone], call back requested.  This is also the phone number for his friend, Marlou Starks.  I called  [phone] and this was the number for a law office.   Dr Wynetta Emery is listed as his PCP but he has not seen her in 3 years.  He could be schedule for a hospital follow up at another [facility] in order to be see more quickly than waiting for an appointment at [facility]

## 2022-05-21 ENCOUNTER — Telehealth: Payer: Self-pay

## 2022-05-21 NOTE — Telephone Encounter (Signed)
Transition Care Management Unsuccessful Follow-up Telephone Call  Date of discharge and from where:  05/19/2022, [facility]  Attempts:  2nd Attempt  Reason for unsuccessful TCM follow-up call:  Unable to reach patient - I called  [phone] and the phone just rang, no option to leave a message.  This is also the phone number for his friend, Marlou Starks.   I called  [phone] and this was the number for a law office.    Dr Wynetta Emery is listed as his PCP but he has not seen her in 3 years.  He could be schedule for a hospital follow up at another [facility] in order to be see more quickly than waiting for an appointment at [facility]

## 2022-05-22 LAB — CULTURE, BLOOD (ROUTINE X 2)
Culture: NO GROWTH
Culture: NO GROWTH
Special Requests: ADEQUATE
Special Requests: ADEQUATE

## 2022-05-25 ENCOUNTER — Telehealth: Payer: Self-pay

## 2022-05-25 NOTE — Telephone Encounter (Signed)
Transition Care Management Unsuccessful Follow-up Telephone Call  Date of discharge and from where:  05/19/2022, [facility]  Attempts:  3rd Attempt  Reason for unsuccessful TCM follow-up call:  Unable to reach patient-  I called  [phone] and the person who answered said I had the wrong number. This is also listed as the phone number for his friend, Marlou Starks.    [phone] is this was the number for a law office.    He has an appointment scheduled at [facility] with Dr Wynetta Emery on 07/03/2022.

## 2022-07-03 ENCOUNTER — Encounter: Payer: Self-pay | Admitting: Internal Medicine

## 2022-07-03 ENCOUNTER — Ambulatory Visit: Payer: Medicare Other | Attending: Internal Medicine | Admitting: Internal Medicine

## 2022-07-03 VITALS — BP 140/80 | HR 92 | Temp 98.0°F | Ht 66.0 in | Wt 128.0 lb

## 2022-07-03 DIAGNOSIS — I10 Essential (primary) hypertension: Secondary | ICD-10-CM | POA: Diagnosis not present

## 2022-07-03 DIAGNOSIS — F1021 Alcohol dependence, in remission: Secondary | ICD-10-CM

## 2022-07-03 DIAGNOSIS — Z23 Encounter for immunization: Secondary | ICD-10-CM

## 2022-07-03 DIAGNOSIS — N400 Enlarged prostate without lower urinary tract symptoms: Secondary | ICD-10-CM

## 2022-07-03 DIAGNOSIS — Z09 Encounter for follow-up examination after completed treatment for conditions other than malignant neoplasm: Secondary | ICD-10-CM | POA: Diagnosis not present

## 2022-07-03 DIAGNOSIS — Z1211 Encounter for screening for malignant neoplasm of colon: Secondary | ICD-10-CM

## 2022-07-03 DIAGNOSIS — R972 Elevated prostate specific antigen [PSA]: Secondary | ICD-10-CM

## 2022-07-03 DIAGNOSIS — F17200 Nicotine dependence, unspecified, uncomplicated: Secondary | ICD-10-CM

## 2022-07-03 DIAGNOSIS — D649 Anemia, unspecified: Secondary | ICD-10-CM

## 2022-07-03 MED ORDER — AMLODIPINE BESYLATE 5 MG PO TABS: 5.0000 mg | ORAL_TABLET | Freq: Every day | ORAL | 1 refills | Status: DC

## 2022-07-03 MED ORDER — FERROUS SULFATE 325 (65 FE) MG PO TABS: ORAL_TABLET | ORAL | 0 refills | Status: AC

## 2022-07-03 NOTE — Progress Notes (Signed)
Patient ID: Steven Arnold, male    DOB: 1946-02-03  MRN: [id number]  CC: Hospitalization Follow-up [facility] f/u. Med refill. Steven Arnold received flu vax this season. )   Subjective: [name] is a 76 y.o. male who presents for hosp f/u His concerns today include:  Patient with history of EtOH use disorder, tobacco dependence, macrocytic anemia, HTN, BPH, homeless  Patient hospitalized 10/15-17/2023 with left hip pain.  Found to have mildly displaced fracture of the left superior and inferior pubic rami.  CT of abdomen and pelvis showed thickened distal esophagus suggesting esophagitis, cholelithiasis and enlarged prostate.  Orthopedics recommended weightbearing as tolerated and outpatient follow-up with Dr. Jena Gauss in 3 weeks.  Seen by therapy and given a rolling walker.  He was discharged on Tylenol to use as needed. -During hospitalization, he had a drop in blood count with hemoglobin going from 11.1 to 8.4.  MCV is 97.5.  B12 level normal.  Ferritin level was 851, iron 56, TIBC 199 and iron saturation of 28%.  Chemistry showed slightly low sodium of 132.  Today: Patient is living in some facility that sounds like a boarding house.  He does not have a phone.  He has not followed up with orthopedics.  He states that his pain has resolved.  He is ambulating today without assistance.  No longer having to take Tylenol.  EtOH use disorder: Reports he stopped drinking completely since being discharged from the hospital.  Previously he was drinking a pint with some beers several days a week.  Albumin was low at 3 in the hospital.  Patient states that where he is staying now he has been eating well especially since he stopped drinking.  HTN, on last visit with me in 2020, we had placed him on amlodipine.  Patient states he has been out of that for a while. BPH: When I had seen him back in 2020, his PSA was elevated at 12.  We have tried getting him to urology.  Sounds like he never got in.  Reports  that he is passing his urine okay as long as he takes the Flomax.  Patient has been homeless.  Anemia: Chronic but worse during hospitalization.  He denies any blood in the stools.  Tobacco dependence: He smokes a third a pack a day.  Smoked since the age of 76.  Not ready to quit. Patient Active Problem List   Diagnosis Date Noted   Normocytic anemia 05/19/2022   BPH (benign prostatic hyperplasia) 05/18/2022   Fracture of multiple pubic rami, left, closed, initial encounter (HCC) 05/17/2022   Elevated PSA 06/03/2019   Tobacco dependence 06/01/2019   Homeless 06/01/2019   Hyponatremia 06/01/2019   Abnormal LFTs 06/01/2019   Macrocytic anemia 06/01/2019   Alcohol use disorder, moderate, dependence (HCC) 06/01/2019   Essential hypertension 06/01/2019   Nocturnal polyuria 06/01/2019     Current Outpatient Medications on File Prior to Visit  Medication Sig Dispense Refill   folic acid (FOLVITE) 1 MG tablet Take 1 tablet (1 mg total) by mouth daily. 30 tablet 1   Multiple Vitamin (MULTIVITAMIN WITH MINERALS) TABS tablet Take 1 tablet by mouth daily. 30 tablet 1   senna-docusate (SENOKOT-S) 8.6-50 MG tablet Take 1 tablet by mouth 2 (two) times daily between meals as needed for moderate constipation.  0   tamsulosin (FLOMAX) 0.4 MG CAPS capsule Take 1 capsule (0.4 mg total) by mouth daily. 90 capsule 1   thiamine (VITAMIN B1) 100 MG tablet Take 1  tablet (100 mg total) by mouth daily. 30 tablet 1   No current facility-administered medications on file prior to visit.    No Known Allergies  Social History   Socioeconomic History   Marital status: Widowed    Spouse name: Not on file   Number of children: 1   Years of education: 10   Highest education level: Not on file  Occupational History   Not on file  Tobacco Use   Smoking status: Heavy Smoker    Packs/day: 0.50    Types: Cigarettes   Smokeless tobacco: Current    Types: Snuff  Vaping Use   Vaping Use: Never used   Substance and Sexual Activity   Alcohol use: Yes    Alcohol/week: 6.0 standard drinks of alcohol    Types: 6 Cans of beer per week   Drug use: No   Sexual activity: Not on file  Other Topics Concern   Not on file  Social History Narrative   Not on file   Social Determinants of Health   Financial Resource Strain: Not on file  Food Insecurity: No Food Insecurity (05/18/2022)   Hunger Vital Sign    Worried About Running Out of Food in the Last Year: Never true    Ran Out of Food in the Last Year: Never true  Transportation Needs: No Transportation Needs (05/18/2022)   PRAPARE - Administrator, Civil Service (Medical): No    Lack of Transportation (Non-Medical): No  Physical Activity: Not on file  Stress: Not on file  Social Connections: Not on file  Intimate Partner Violence: Not At Risk (05/18/2022)   Humiliation, Afraid, Rape, and Kick questionnaire    Fear of Current or Ex-Partner: No    Emotionally Abused: No    Physically Abused: No    Sexually Abused: No    No family history on file.  Past Surgical History:  Procedure Laterality Date   NO PAST SURGERIES      ROS: Review of Systems Negative except as stated above  PHYSICAL EXAM: BP (!) 140/80   Pulse 92   Temp 98 F (36.7 C) (Oral)   Ht 5\' 6"  (1.676 m)   Wt 128 lb (58.1 kg)   SpO2 99%   BMI 20.66 kg/m   Physical Exam BP 140/80  General appearance -pleasant elderly African-American male and in no distress.  Clothing is clean. Mental status -poor historian.  Answers most questions appropriately  Neck - supple, no significant adenopathy Chest - clear to auscultation, no wheezes, rales or rhonchi, symmetric air entry Heart - normal rate, regular rhythm, normal S1, S2, no murmurs, rubs, clicks or gallops Extremities - peripheral pulses normal, no pedal edema, no clubbing or cyanosis     Latest Ref Rng & Units 05/19/2022    4:07 AM 05/18/2022    4:40 AM 05/17/2022    5:40 PM  CMP  Glucose  70 - 99 mg/dL 05/19/2022  85  89   BUN 8 - 23 mg/dL 15  19  24    Creatinine 0.61 - 1.24 mg/dL 948   5.46   Sodium 135 - 145 mmol/L 132  133  134   Potassium 3.5 - 5.1 mmol/L 3.8  3.3  4.6   Chloride 98 - 111 mmol/L 100  95  94   CO2 22 - 32 mmol/L 26  25  24    Calcium 8.9 - 10.3 mg/dL 8.7  8.6  9.4   Total Protein 6.5 - 8.1  g/dL   8.1   Total Bilirubin 0.3 - 1.2 mg/dL   0.8   Alkaline Phos 38 - 126 U/L   62   AST 15 - 41 U/L   38   ALT 0 - 44 U/L   19    Lipid Panel  No results found for: "CHOL", "TRIG", "HDL", "CHOLHDL", "VLDL", "LDLCALC", "LDLDIRECT"  CBC    Component Value Date/Time   WBC 5.7 05/19/2022 0407   RBC 2.40 (L) 05/19/2022 0407   HGB 8.4 (L) 05/19/2022 0407   HGB 11.0 (L) 06/01/2019 1512   HCT 23.4 (L) 05/19/2022 0407   HCT 31.7 (L) 06/01/2019 1512   PLT 148 (L) 05/19/2022 0407   MCV 97.5 05/19/2022 0407   MCV 98 (H) 06/01/2019 1512   MCH 35.0 (H) 05/19/2022 0407   MCHC 35.9 05/19/2022 0407   RDW 12.2 05/19/2022 0407   RDW 11.8 06/01/2019 1512   LYMPHSABS 0.6 (L) 05/17/2022 1740   LYMPHSABS 1.9 06/01/2019 1512   MONOABS 0.4 05/17/2022 1740   EOSABS 0.0 05/17/2022 1740   EOSABS 0.7 (H) 06/01/2019 1512   BASOSABS 0.0 05/17/2022 1740   BASOSABS 0.1 06/01/2019 1512    ASSESSMENT AND PLAN:  1. Hospital discharge follow-up Doing better since hospital discharge.  Ambulating independently.  No further pain in the left hip.  2. Benign prostatic hyperplasia without lower urinary tract symptoms Continue Flomax.  We will try to get him into urology for this and also for the history of elevated PSA.  Patient states he will call back with the phone number of a friend where call can be made and information left about appointment date and time. - Ambulatory referral to Urology  3. Elevated PSA See #2 above - Ambulatory referral to Urology  4. Essential hypertension Repeat blood pressure is better but not at goal.  We will put him back on low-dose amlodipine. -  amLODipine (NORVASC) 5 MG tablet; Take 1 tablet (5 mg total) by mouth daily.  Dispense: 90 tablet; Refill: 1  5. Chronic anemia - CBC  6. Tobacco dependence Strongly advised to quit smoking.  7. Alcohol use disorder, moderate, in early remission (HCC) Commended him on quitting.  Encouraged him to remain tobacco free  8. Need for vaccination against Streptococcus pneumoniae - Pneumococcal conjugate vaccine 20-valent  9. Need for influenza vaccination - Flu Vaccine QUAD High Dose(Fluad)  10. Screening for colon cancer - Ambulatory referral to Gastroenterology    Patient was given the opportunity to ask questions.  Patient verbalized understanding of the plan and was able to repeat key elements of the plan.   This documentation was completed using Paediatric nurse.  Any transcriptional errors are unintentional.  Orders Placed This Encounter  Procedures   Flu Vaccine QUAD High Dose(Fluad)   Pneumococcal conjugate vaccine 20-valent   CBC   Ambulatory referral to Gastroenterology   Ambulatory referral to Urology     Requested Prescriptions   Signed Prescriptions Disp Refills   ferrous sulfate 325 (65 FE) MG tablet 60 tablet 0    Sig: 1 tab PO Q Mon/Wed/Fri PO   amLODipine (NORVASC) 5 MG tablet 90 tablet 1    Sig: Take 1 tablet (5 mg total) by mouth daily.    Return in about 6 weeks (around 08/14/2022) for Medicare Wellness Visit.  Jonah Blue, MD, FACP

## 2022-07-04 LAB — CBC
Hematocrit: 28.3 % — ABNORMAL LOW (ref 37.5–51.0)
Hemoglobin: 10 g/dL — ABNORMAL LOW (ref 13.0–17.7)
MCH: 33.6 pg — ABNORMAL HIGH (ref 26.6–33.0)
MCHC: 35.3 g/dL (ref 31.5–35.7)
MCV: 95 fL (ref 79–97)
Platelets: 297 10*3/uL (ref 150–450)
RBC: 2.98 x10E6/uL — ABNORMAL LOW (ref 4.14–5.80)
RDW: 11.6 % (ref 11.6–15.4)
WBC: 6.7 10*3/uL (ref 3.4–10.8)

## 2022-09-28 ENCOUNTER — Ambulatory Visit: Payer: 59 | Attending: Internal Medicine | Admitting: Internal Medicine

## 2022-09-28 ENCOUNTER — Encounter: Payer: Self-pay | Admitting: Internal Medicine

## 2022-09-28 VITALS — BP 155/75 | HR 67 | Temp 98.0°F | Ht 66.0 in | Wt 130.0 lb

## 2022-09-28 DIAGNOSIS — Z7189 Other specified counseling: Secondary | ICD-10-CM | POA: Diagnosis not present

## 2022-09-28 DIAGNOSIS — R972 Elevated prostate specific antigen [PSA]: Secondary | ICD-10-CM

## 2022-09-28 DIAGNOSIS — Z0000 Encounter for general adult medical examination without abnormal findings: Secondary | ICD-10-CM | POA: Diagnosis not present

## 2022-09-28 DIAGNOSIS — I10 Essential (primary) hypertension: Secondary | ICD-10-CM

## 2022-09-28 DIAGNOSIS — H5203 Hypermetropia, bilateral: Secondary | ICD-10-CM

## 2022-09-28 DIAGNOSIS — Z2911 Encounter for prophylactic immunotherapy for respiratory syncytial virus (RSV): Secondary | ICD-10-CM

## 2022-09-28 DIAGNOSIS — Z23 Encounter for immunization: Secondary | ICD-10-CM

## 2022-09-28 DIAGNOSIS — F17200 Nicotine dependence, unspecified, uncomplicated: Secondary | ICD-10-CM

## 2022-09-28 DIAGNOSIS — F1021 Alcohol dependence, in remission: Secondary | ICD-10-CM

## 2022-09-28 DIAGNOSIS — Z1211 Encounter for screening for malignant neoplasm of colon: Secondary | ICD-10-CM

## 2022-09-28 MED ORDER — RSVPREF3 VAC RECOMB ADJUVANTED 120 MCG/0.5ML IM SUSR: 0.5000 mL | Freq: Once | INTRAMUSCULAR | 0 refills | Status: AC

## 2022-09-28 MED ORDER — ZOSTER VAC RECOMB ADJUVANTED 50 MCG/0.5ML IM SUSR: 0.5000 mL | Freq: Once | INTRAMUSCULAR | 0 refills | Status: AC

## 2022-09-28 MED ORDER — AMLODIPINE BESYLATE 10 MG PO TABS: 10.0000 mg | ORAL_TABLET | Freq: Every day | ORAL | 3 refills | Status: AC

## 2022-09-28 NOTE — Progress Notes (Signed)
Subjective:   Steven Arnold is a 77 y.o. male who presents for a Welcome to Medicare exam.  Patient with history of EtOH use disorder, tobacco dependence, macrocytic anemia, HTN, BPH, homeless, PSA, HTN, , ETOH use in remission  Review of Systems: GEN:  5 mths free of ETOH.  Still smoking.  1 pk last 1 wk [facility]:  started on Norvasc 5 mg daily on last visit.  Pt thinks he is taking it every day as prescribed and took already today.  No CP/SOB   GU:  taking Flomax. Wakes up one time at nights to urinate. Did not get in with urology for f/u elev PSA.  Currently does not have a phone   GI: Referred to GI on last visit for colonoscopy.  They tried to reach him unsuccessfully.  Patient does not have a phone. Objective:    Today's Vitals   09/28/22 1459  BP: (!) 155/75  Pulse: 67  Temp: 98 F (36.7 C)  TempSrc: Oral  SpO2: 99%  Weight: 130 lb (59 kg)  Height: '5\' 6"'$  (1.676 m)   Body mass index is 20.98 kg/m.  Medications Outpatient Encounter Medications as of 09/28/2022  Medication Sig   RSV vaccine recomb adjuvanted (AREXVY) 120 MCG/0.5ML injection Inject 0.5 mLs into the muscle once for 1 dose.   Zoster Vaccine Adjuvanted [facility]) injection Inject 0.5 mLs into the muscle once for 1 dose.   [DISCONTINUED] amLODipine (NORVASC) 5 MG tablet Take 1 tablet (5 mg total) by mouth daily.   amLODipine (NORVASC) 10 MG tablet Take 1 tablet (10 mg total) by mouth daily.   ferrous sulfate 325 (65 FE) MG tablet 1 tab PO Q Mon/Wed/Fri PO   folic acid (FOLVITE) 1 MG tablet Take 1 tablet (1 mg total) by mouth daily.   Multiple Vitamin (MULTIVITAMIN WITH MINERALS) TABS tablet Take 1 tablet by mouth daily.   senna-docusate (SENOKOT-S) 8.6-50 MG tablet Take 1 tablet by mouth 2 (two) times daily between meals as needed for moderate constipation.   tamsulosin (FLOMAX) 0.4 MG CAPS capsule Take 1 capsule (0.4 mg total) by mouth daily.   thiamine (VITAMIN B1) 100 MG tablet Take 1 tablet (100 mg total) by  mouth daily.   No facility-administered encounter medications on file as of 09/28/2022.     History: Past Medical History:  Diagnosis Date   ETOH abuse    Homeless    HTN (hypertension)    Past Surgical History:  Procedure Laterality Date   NO PAST SURGERIES      No family history on file. Social History   Occupational History   Not on file  Tobacco Use   Smoking status: Heavy Smoker    Packs/day: 0.50    Types: Cigarettes   Smokeless tobacco: Current    Types: Snuff  Vaping Use   Vaping Use: Never used  Substance and Sexual Activity   Alcohol use: Yes    Alcohol/week: 6.0 standard drinks of alcohol    Types: 6 Cans of beer per week   Drug use: No   Sexual activity: Not on file   Tobacco Counseling -plans to quit,  declines help in quitting  Immunizations and Health Maintenance Immunization History  Administered Date(s) Administered   Fluad Quad(high Dose 65+) 07/03/2022   Influenza,inj,Quad PF,6+ Mos 06/01/2019   PNEUMOCOCCAL CONJUGATE-20 07/03/2022   Pneumococcal Conjugate-13 06/01/2019   Tdap 08/01/2012, 04/10/2018, 03/27/2019   Health Maintenance Due  Topic Date Due   COVID-19 Vaccine (1) Never done  COLONOSCOPY (Pts 45-2yr Insurance coverage will need to be confirmed)  Never done   Zoster Vaccines- Shingrix (1 of 2) Never done  Needs RSV vaccine [facility]    09/28/2022    2:53 PM 05/18/2022    5:00 PM  In your present state of health, do you have any difficulty performing the following activities:  Hearing? 0 0  Vision? 0 0  Difficulty concentrating or making decisions? 0 0  Walking or climbing stairs? 0 0  Dressing or bathing? 0 0  Doing errands, shopping? 0 1  Preparing Food and eating ? N   Using the Toilet? N   In the past six months, have you accidently leaked urine? Y   Do you have problems with loss of bowel control? N   Managing your  Medications? N   Managing your Finances? N   Housekeeping or managing your Housekeeping? N   Still living at boarding place  Physical Exam  (optional), or other factors deemed appropriate based on the beneficiary's medical and social history and current clinical standards. General: Elderly African-American male in NAD Chest: Clear to auscultation bilaterally CVS: Regular rate rhythm no gallops or murmurs: Extremities no lower extremity edema Advanced Directives: Does Patient Have a Medical Advance Directive?: No Would patient like information on creating a medical advance directive?: Yes (MAU/Ambulatory/Procedural Areas - Information given)    Assessment:    This is a routine wellness  examination for this patient .   Vision/Hearing screen Vision Screening   Right eye Left eye Both eyes  Without correction '20/70 20/50 20/50 '$  With correction     Would like eye exam.  Problems reading things close up Whisper test was normal.  Dietary issues and exercise activities discussed:   Does well with eating habits Walks daily for 30-45 mins.  Does not drive   Goals      Quit Smoking     To quit smoking by the end of the year.       Depression Screen    09/28/2022    3:02 PM 09/28/2022    2:49 PM 07/03/2022    2:33 PM  PHQ 2/9 Scores  PHQ - 2 Score 0 0 0  PHQ- 9 Score 1  2     Fall Risk    09/28/2022    2:44 PM  [place]  Number falls in past yr: 0  Injury with Fall? 0  Risk for fall due to : No Fall Risks    Cognitive Function    09/28/2022    2:54 PM  MMSE - Mini Mental State Exam  Orientation to time 5  Orientation to Place 5  Registration 3  Attention/ Calculation 3  Recall 3  Language- name 2 objects 2  Language- repeat 1  Language- follow 3 step command 3  Language- read & follow direction 1  Write a sentence 1  Copy design 1  Total score 28        Patient Care Team: JLadell Pier MD as PCP - General (Internal  Medicine)     Plan:    1. Encounter for Medicare annual wellness exam   2. Advance directive discussed with patient Discussed advanced directive with patient including what is an advanced directive, living will and healthcare power of attorney.  Packet given.  Advised that should he decide to execute  a living will or healthcare power of attorney, he should have it notarized and bring a copy for our records.  3. Farsightedness, bilateral Refer to ophthalmology but getting in contact with him will be an issue - Ambulatory referral to Ophthalmology  4. Essential hypertension Not at goal.  Increase amlodipine to 10 mg daily - amLODipine (NORVASC) 10 MG tablet; Take 1 tablet (10 mg total) by mouth daily.  Dispense: 90 tablet; Refill: 3  5. Tobacco dependence Commended him on cutting back on wanting to quit.  Discussed methods to help him quit.  Patient feels he will quit on his own.  6. Alcohol use disorder, moderate, in early remission ([place]) Commended him for remaining alcohol free  7. Elevated PSA Once he gets a phone, he will let me know so that we could resubmit referral to urology  8. Need for shingles vaccine Agreeable to receiving Shingrix vaccine series.  Printed prescription and gave to him to take to his outside pharmacy - Zoster Vaccine Adjuvanted [facility]) injection; Inject 0.5 mLs into the muscle once for 1 dose.  Dispense: 0.5 mL; Refill: 0  9. Need for RSV vaccination Patient agreeable to RSV vaccine.  Printed prescription given to take to his pharmacy - RSV vaccine recomb adjuvanted (AREXVY) 120 MCG/0.5ML injection; Inject 0.5 mLs into the muscle once for 1 dose.  Dispense: 0.5 mL; Refill: 0  10. Screening for colon cancer He is agreeable to doing the fit test since GI will not be able to reach him to schedule colonoscopy. - Fecal occult blood, imunochemical([facility])  I have personally reviewed and noted the following in the patient's chart:   Medical  and social history Use of alcohol, tobacco or illicit drugs  Current medications and supplements Functional ability and status Nutritional status Physical activity Advanced directives List of other physicians Hospitalizations, surgeries, and ER visits in previous 12 months Vitals Screenings to include cognitive, depression, and falls Referrals and appointments  In addition, I have reviewed and discussed with patient certain preventive protocols, quality metrics, and best practice recommendations. A written personalized care plan for preventive services as well as general preventive health recommendations were provided to patient.    Karle Plumber, MD 09/28/2022

## 2022-09-28 NOTE — Patient Instructions (Signed)
We have given you a packet discusses and goes over advanced directive.  If you execute a living will or healthcare power of attorney, please bring a copy for our records.  Your blood pressure is not at goal.  We have increased the amlodipine to 10 mg daily.  I have given you a prescription so that you can get your shingles vaccine and RSV vaccine.  Please take the prescription to your pharmacy.  Please set a quit date to discontinue smoking.

## 2023-01-25 ENCOUNTER — Ambulatory Visit: Payer: 59 | Admitting: Internal Medicine

## 2023-01-27 ENCOUNTER — Encounter: Payer: Self-pay | Admitting: Internal Medicine

## 2023-01-27 NOTE — Progress Notes (Signed)
I received a letter from [facility] urology 01/22/2023 informing me that the patient has not responded to multiple attempts to schedule an appointment.

## 2024-03-09 ENCOUNTER — Other Ambulatory Visit: Payer: Self-pay

## 2024-05-26 ENCOUNTER — Emergency Department (HOSPITAL_COMMUNITY)

## 2024-05-26 ENCOUNTER — Other Ambulatory Visit: Payer: Self-pay

## 2024-05-26 ENCOUNTER — Encounter (HOSPITAL_COMMUNITY): Payer: Self-pay

## 2024-05-26 ENCOUNTER — Inpatient Hospital Stay (HOSPITAL_COMMUNITY)
Admission: EM | Admit: 2024-05-26 | Discharge: 2024-06-03 | DRG: 447 | Disposition: A | Discharge status: Skilled Nursing Facility | Attending: Surgery | Admitting: Surgery

## 2024-05-26 DIAGNOSIS — N400 Enlarged prostate without lower urinary tract symptoms: Secondary | ICD-10-CM | POA: Diagnosis present

## 2024-05-26 DIAGNOSIS — S2222XA Fracture of body of sternum, initial encounter for closed fracture: Secondary | ICD-10-CM | POA: Diagnosis present

## 2024-05-26 DIAGNOSIS — Z79899 Other long term (current) drug therapy: Secondary | ICD-10-CM | POA: Diagnosis not present

## 2024-05-26 DIAGNOSIS — S22078A Other fracture of T9-T10 vertebra, initial encounter for closed fracture: Principal | ICD-10-CM | POA: Diagnosis present

## 2024-05-26 DIAGNOSIS — S2242XA Multiple fractures of ribs, left side, initial encounter for closed fracture: Secondary | ICD-10-CM | POA: Diagnosis present

## 2024-05-26 DIAGNOSIS — Q676 Pectus excavatum: Secondary | ICD-10-CM

## 2024-05-26 DIAGNOSIS — Z7982 Long term (current) use of aspirin: Secondary | ICD-10-CM | POA: Diagnosis not present

## 2024-05-26 DIAGNOSIS — E876 Hypokalemia: Secondary | ICD-10-CM | POA: Diagnosis present

## 2024-05-26 DIAGNOSIS — E871 Hypo-osmolality and hyponatremia: Secondary | ICD-10-CM | POA: Diagnosis present

## 2024-05-26 DIAGNOSIS — I252 Old myocardial infarction: Secondary | ICD-10-CM | POA: Diagnosis not present

## 2024-05-26 DIAGNOSIS — G928 Other toxic encephalopathy: Secondary | ICD-10-CM | POA: Diagnosis present

## 2024-05-26 DIAGNOSIS — F10129 Alcohol abuse with intoxication, unspecified: Secondary | ICD-10-CM | POA: Diagnosis present

## 2024-05-26 DIAGNOSIS — I440 Atrioventricular block, first degree: Secondary | ICD-10-CM | POA: Diagnosis present

## 2024-05-26 DIAGNOSIS — F17210 Nicotine dependence, cigarettes, uncomplicated: Secondary | ICD-10-CM | POA: Diagnosis present

## 2024-05-26 DIAGNOSIS — Z7951 Long term (current) use of inhaled steroids: Secondary | ICD-10-CM | POA: Diagnosis not present

## 2024-05-26 DIAGNOSIS — I10 Essential (primary) hypertension: Secondary | ICD-10-CM | POA: Diagnosis present

## 2024-05-26 DIAGNOSIS — Y92410 Unspecified street and highway as the place of occurrence of the external cause: Secondary | ICD-10-CM

## 2024-05-26 DIAGNOSIS — S22079A Unspecified fracture of T9-T10 vertebra, initial encounter for closed fracture: Principal | ICD-10-CM | POA: Diagnosis present

## 2024-05-26 DIAGNOSIS — S2222XD Fracture of body of sternum, subsequent encounter for fracture with routine healing: Secondary | ICD-10-CM

## 2024-05-26 DIAGNOSIS — F17200 Nicotine dependence, unspecified, uncomplicated: Secondary | ICD-10-CM | POA: Diagnosis not present

## 2024-05-26 HISTORY — DX: Dyspnea, unspecified: R06.00

## 2024-05-26 LAB — COMPREHENSIVE METABOLIC PANEL WITH GFR
ALT: 20 U/L (ref 0–44)
AST: 43 U/L — ABNORMAL HIGH (ref 15–41)
Albumin: 3.9 g/dL (ref 3.5–5.0)
Alkaline Phosphatase: 48 U/L (ref 38–126)
Anion gap: 15 (ref 5–15)
BUN: 9 mg/dL (ref 8–23)
CO2: 17 mmol/L — ABNORMAL LOW (ref 22–32)
Calcium: 9 mg/dL (ref 8.9–10.3)
Chloride: 100 mmol/L (ref 98–111)
Creatinine, Ser: 0.87 mg/dL (ref 0.61–1.24)
GFR, Estimated: >60 mL/min (ref >60)
Glucose, Bld: 133 mg/dL — ABNORMAL HIGH (ref 70–99)
Potassium: 3 mmol/L — ABNORMAL LOW (ref 3.5–5.1)
Sodium: 132 mmol/L — ABNORMAL LOW (ref 135–145)
Total Bilirubin: 0.8 mg/dL (ref 0.0–1.2)
Total Protein: 7.7 g/dL (ref 6.5–8.1)

## 2024-05-26 LAB — CBC
HCT: 31 % — ABNORMAL LOW (ref 39.0–52.0)
Hemoglobin: 10.2 g/dL — ABNORMAL LOW (ref 13.0–17.0)
MCH: 33.6 pg (ref 26.0–34.0)
MCHC: 32.9 g/dL (ref 30.0–36.0)
MCV: 102 fL — ABNORMAL HIGH (ref 80.0–100.0)
Platelets: 261 K/uL (ref 150–400)
RBC: 3.04 MIL/uL — ABNORMAL LOW (ref 4.22–5.81)
RDW: 13.4 % (ref 11.5–15.5)
WBC: 7.2 K/uL (ref 4.0–10.5)
nRBC: 0 % (ref 0.0–0.2)

## 2024-05-26 LAB — URINALYSIS, ROUTINE W REFLEX MICROSCOPIC
Bacteria, UA: NONE SEEN
Bilirubin Urine: NEGATIVE
Glucose, UA: NEGATIVE mg/dL
Ketones, ur: NEGATIVE mg/dL
Leukocytes,Ua: NEGATIVE
Nitrite: NEGATIVE
Protein, ur: NEGATIVE mg/dL
Specific Gravity, Urine: 1.005 (ref 1.005–1.030)
pH: 6 (ref 5.0–8.0)

## 2024-05-26 LAB — I-STAT CHEM 8, ED
BUN: 9 mg/dL (ref 8–23)
Calcium, Ion: 1.14 mmol/L — ABNORMAL LOW (ref 1.15–1.40)
Chloride: 101 mmol/L (ref 98–111)
Creatinine, Ser: 1.1 mg/dL (ref 0.61–1.24)
Glucose, Bld: 134 mg/dL — ABNORMAL HIGH (ref 70–99)
HCT: 32 % — ABNORMAL LOW (ref 39.0–52.0)
Hemoglobin: 10.9 g/dL — ABNORMAL LOW (ref 13.0–17.0)
Potassium: 3.1 mmol/L — ABNORMAL LOW (ref 3.5–5.1)
Sodium: 136 mmol/L (ref 135–145)
TCO2: 18 mmol/L — ABNORMAL LOW (ref 22–32)

## 2024-05-26 LAB — PROTIME-INR
INR: 1.1 (ref 0.8–1.2)
Prothrombin Time: 15 s (ref 11.4–15.2)

## 2024-05-26 LAB — ETHANOL: Alcohol, Ethyl (B): 190 mg/dL — ABNORMAL HIGH (ref <15)

## 2024-05-26 LAB — SAMPLE TO BLOOD BANK

## 2024-05-26 LAB — I-STAT CG4 LACTIC ACID, ED: Lactic Acid, Venous: 3.5 mmol/L (ref 0.5–1.9)

## 2024-05-26 MED ORDER — MORPHINE SULFATE (PF) 2 MG/ML IV SOLN
INTRAVENOUS | Status: AC
  Filled 2024-05-26: qty 1

## 2024-05-26 MED ORDER — ONDANSETRON HCL 4 MG/2ML IJ SOLN
4.0000 mg | Freq: Four times a day (QID) | INTRAMUSCULAR | Status: DC | PRN
  Administered 2024-05-27: 4 mg via INTRAVENOUS
  Filled 2024-05-26: qty 2

## 2024-05-26 MED ORDER — OXYCODONE HCL 5 MG PO TABS
5.0000 mg | ORAL_TABLET | ORAL | Status: DC | PRN
  Administered 2024-05-28 – 2024-05-31 (×3): 5 mg via ORAL
  Filled 2024-05-26 (×3): qty 1

## 2024-05-26 MED ORDER — METHOCARBAMOL 500 MG PO TABS
500.0000 mg | ORAL_TABLET | Freq: Three times a day (TID) | ORAL | Status: DC
  Administered 2024-05-27: 500 mg via ORAL
  Filled 2024-05-26: qty 1

## 2024-05-26 MED ORDER — LORAZEPAM 1 MG PO TABS: 1.0000 mg | ORAL_TABLET | ORAL | Status: AC | PRN

## 2024-05-26 MED ORDER — MORPHINE SULFATE (PF) 4 MG/ML IV SOLN: 4.0000 mg | Freq: Once | INTRAVENOUS | Status: DC

## 2024-05-26 MED ORDER — SODIUM CHLORIDE 0.9 % IV BOLUS
1000.0000 mL | Freq: Once | INTRAVENOUS | Status: AC
  Administered 2024-05-26: 1000 mL via INTRAVENOUS

## 2024-05-26 MED ORDER — FOLIC ACID 1 MG PO TABS
1.0000 mg | ORAL_TABLET | Freq: Every day | ORAL | Status: DC
  Administered 2024-05-27 – 2024-06-03 (×8): 1 mg via ORAL
  Filled 2024-05-26 (×8): qty 1

## 2024-05-26 MED ORDER — MORPHINE SULFATE (PF) 2 MG/ML IV SOLN
2.0000 mg | Freq: Once | INTRAVENOUS | Status: AC
  Administered 2024-05-26: 2 mg via INTRAVENOUS

## 2024-05-26 MED ORDER — METHOCARBAMOL 1000 MG/10ML IJ SOLN: 500.0000 mg | Freq: Three times a day (TID) | INTRAMUSCULAR | Status: DC

## 2024-05-26 MED ORDER — POLYETHYLENE GLYCOL 3350 17 G PO PACK
17.0000 g | PACK | Freq: Every day | ORAL | Status: DC | PRN
  Administered 2024-05-31: 17 g via ORAL
  Filled 2024-05-26: qty 1

## 2024-05-26 MED ORDER — HYDRALAZINE HCL 20 MG/ML IJ SOLN
10.0000 mg | INTRAMUSCULAR | Status: DC | PRN
  Administered 2024-05-28: 10 mg via INTRAVENOUS
  Filled 2024-05-26: qty 1

## 2024-05-26 MED ORDER — THIAMINE MONONITRATE 100 MG PO TABS
100.0000 mg | ORAL_TABLET | Freq: Every day | ORAL | Status: DC
  Administered 2024-05-27 – 2024-06-03 (×8): 100 mg via ORAL
  Filled 2024-05-26 (×8): qty 1

## 2024-05-26 MED ORDER — IOHEXOL 350 MG/ML SOLN
75.0000 mL | Freq: Once | INTRAVENOUS | Status: AC | PRN
  Administered 2024-05-26: 75 mL via INTRAVENOUS

## 2024-05-26 MED ORDER — LORAZEPAM 2 MG/ML IJ SOLN: 1.0000 mg | INTRAMUSCULAR | Status: AC | PRN

## 2024-05-26 MED ORDER — METOPROLOL TARTRATE 5 MG/5ML IV SOLN: 5.0000 mg | Freq: Four times a day (QID) | INTRAVENOUS | Status: DC | PRN

## 2024-05-26 MED ORDER — LACTATED RINGERS IV SOLN: INTRAVENOUS | Status: AC

## 2024-05-26 MED ORDER — THIAMINE HCL 100 MG/ML IJ SOLN
100.0000 mg | Freq: Every day | INTRAMUSCULAR | Status: DC
Start: 2024-05-27 — End: 2024-06-03
  Filled 2024-05-26: qty 2

## 2024-05-26 MED ORDER — DOCUSATE SODIUM 100 MG PO CAPS
100.0000 mg | ORAL_CAPSULE | Freq: Two times a day (BID) | ORAL | Status: DC
  Administered 2024-05-27 – 2024-06-03 (×16): 100 mg via ORAL
  Filled 2024-05-26 (×16): qty 1

## 2024-05-26 MED ORDER — OXYCODONE HCL 5 MG PO TABS
10.0000 mg | ORAL_TABLET | ORAL | Status: DC | PRN
  Administered 2024-05-28 – 2024-06-03 (×7): 10 mg via ORAL
  Filled 2024-05-26 (×7): qty 2

## 2024-05-26 MED ORDER — HYDROMORPHONE HCL 1 MG/ML IJ SOLN
0.5000 mg | INTRAMUSCULAR | Status: DC | PRN
  Administered 2024-05-27 – 2024-06-03 (×5): 0.5 mg via INTRAVENOUS
  Filled 2024-05-26: qty 0.5
  Filled 2024-05-26: qty 1
  Filled 2024-05-26 (×3): qty 0.5

## 2024-05-26 MED ORDER — ONDANSETRON 4 MG PO TBDP: 4.0000 mg | ORAL_TABLET | Freq: Four times a day (QID) | ORAL | Status: DC | PRN

## 2024-05-26 MED ORDER — ADULT MULTIVITAMIN W/MINERALS CH
1.0000 | ORAL_TABLET | Freq: Every day | ORAL | Status: DC
  Administered 2024-05-27 – 2024-06-03 (×8): 1 via ORAL
  Filled 2024-05-26 (×8): qty 1

## 2024-05-26 MED ORDER — ACETAMINOPHEN 500 MG PO TABS
1000.0000 mg | ORAL_TABLET | Freq: Four times a day (QID) | ORAL | Status: DC
  Administered 2024-05-27 – 2024-06-02 (×24): 1000 mg via ORAL
  Filled 2024-05-26 (×29): qty 2

## 2024-05-26 MED ORDER — MELATONIN 3 MG PO TABS
3.0000 mg | ORAL_TABLET | Freq: Every evening | ORAL | Status: DC | PRN
  Administered 2024-05-28 – 2024-06-01 (×3): 3 mg via ORAL
  Filled 2024-05-26 (×4): qty 1
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
## 2024-05-27 ENCOUNTER — Inpatient Hospital Stay (HOSPITAL_COMMUNITY)

## 2024-05-27 ENCOUNTER — Inpatient Hospital Stay (HOSPITAL_COMMUNITY): Admitting: Anesthesiology

## 2024-05-27 ENCOUNTER — Encounter (HOSPITAL_COMMUNITY): Admission: EM | Disposition: A | Discharge status: Skilled Nursing Facility | Payer: Self-pay | Source: Home / Self Care

## 2024-05-27 ENCOUNTER — Encounter (HOSPITAL_COMMUNITY): Payer: Self-pay

## 2024-05-27 DIAGNOSIS — I10 Essential (primary) hypertension: Secondary | ICD-10-CM

## 2024-05-27 DIAGNOSIS — F17200 Nicotine dependence, unspecified, uncomplicated: Secondary | ICD-10-CM | POA: Diagnosis not present

## 2024-05-27 DIAGNOSIS — S22079A Unspecified fracture of T9-T10 vertebra, initial encounter for closed fracture: Secondary | ICD-10-CM

## 2024-05-27 LAB — SURGICAL PCR SCREEN
MRSA, PCR: NEGATIVE
Staphylococcus aureus: NEGATIVE

## 2024-05-27 LAB — BASIC METABOLIC PANEL WITH GFR
Anion gap: 12 (ref 5–15)
BUN: 7 mg/dL — ABNORMAL LOW (ref 8–23)
CO2: 21 mmol/L — ABNORMAL LOW (ref 22–32)
Calcium: 8.9 mg/dL (ref 8.9–10.3)
Chloride: 100 mmol/L (ref 98–111)
Creatinine, Ser: 0.84 mg/dL (ref 0.61–1.24)
GFR, Estimated: >60 mL/min (ref >60)
Glucose, Bld: 97 mg/dL (ref 70–99)
Potassium: 3.9 mmol/L (ref 3.5–5.1)
Sodium: 133 mmol/L — ABNORMAL LOW (ref 135–145)

## 2024-05-27 LAB — CBC
HCT: 27.8 % — ABNORMAL LOW (ref 39.0–52.0)
Hemoglobin: 9.6 g/dL — ABNORMAL LOW (ref 13.0–17.0)
MCH: 33.9 pg (ref 26.0–34.0)
MCHC: 34.5 g/dL (ref 30.0–36.0)
MCV: 98.2 fL (ref 80.0–100.0)
Platelets: 197 K/uL (ref 150–400)
RBC: 2.83 MIL/uL — ABNORMAL LOW (ref 4.22–5.81)
RDW: 13.4 % (ref 11.5–15.5)
WBC: 8.1 K/uL (ref 4.0–10.5)
nRBC: 0 % (ref 0.0–0.2)

## 2024-05-27 LAB — TYPE AND SCREEN
ABO/RH(D): B POS
Antibody Screen: NEGATIVE

## 2024-05-27 LAB — ABO/RH: ABO/RH(D): B POS

## 2024-05-27 SURGERY — POSTERIOR THORACIC FUSION 1 LEVEL
Anesthesia: General | Site: Back

## 2024-05-27 MED ORDER — LIDOCAINE 2% (20 MG/ML) 5 ML SYRINGE
INTRAMUSCULAR | Status: AC
  Filled 2024-05-27: qty 5

## 2024-05-27 MED ORDER — ACETAMINOPHEN 650 MG RE SUPP: 650.0000 mg | RECTAL | Status: DC | PRN

## 2024-05-27 MED ORDER — HYDROMORPHONE HCL 1 MG/ML IJ SOLN
INTRAMUSCULAR | Status: DC | PRN
  Administered 2024-05-27 (×2): .5 mg via INTRAVENOUS

## 2024-05-27 MED ORDER — ACETAMINOPHEN 10 MG/ML IV SOLN
INTRAVENOUS | Status: DC | PRN
  Administered 2024-05-27: 1000 mg via INTRAVENOUS

## 2024-05-27 MED ORDER — 0.9 % SODIUM CHLORIDE (POUR BTL) OPTIME
TOPICAL | Status: DC | PRN
  Administered 2024-05-27: 1000 mL

## 2024-05-27 MED ORDER — DEXAMETHASONE SOD PHOSPHATE PF 10 MG/ML IJ SOLN
INTRAMUSCULAR | Status: DC | PRN
  Administered 2024-05-27: 10 mg via INTRAVENOUS

## 2024-05-27 MED ORDER — ROCURONIUM BROMIDE 10 MG/ML (PF) SYRINGE
PREFILLED_SYRINGE | INTRAVENOUS | Status: DC | PRN
  Administered 2024-05-27: 60 mg via INTRAVENOUS
  Administered 2024-05-27: 40 mg via INTRAVENOUS

## 2024-05-27 MED ORDER — DEXMEDETOMIDINE HCL IN NACL 80 MCG/20ML IV SOLN
INTRAVENOUS | Status: DC | PRN
  Administered 2024-05-27: 8 ug via INTRAVENOUS

## 2024-05-27 MED ORDER — IPRATROPIUM-ALBUTEROL 0.5-2.5 (3) MG/3ML IN SOLN
RESPIRATORY_TRACT | Status: AC
Start: 2024-05-27 — End: 2024-05-27
  Filled 2024-05-27: qty 3

## 2024-05-27 MED ORDER — PHENYLEPHRINE HCL-NACL 20-0.9 MG/250ML-% IV SOLN
INTRAVENOUS | Status: DC | PRN
  Administered 2024-05-27: 15 ug/min via INTRAVENOUS

## 2024-05-27 MED ORDER — LIDOCAINE-EPINEPHRINE 1 %-1:100000 IJ SOLN
INTRAMUSCULAR | Status: DC | PRN
  Administered 2024-05-27: 4 mL via INTRADERMAL

## 2024-05-27 MED ORDER — AMISULPRIDE (ANTIEMETIC) 5 MG/2ML IV SOLN: 10.0000 mg | Freq: Once | INTRAVENOUS | Status: DC | PRN

## 2024-05-27 MED ORDER — MIDAZOLAM HCL 2 MG/2ML IJ SOLN
INTRAMUSCULAR | Status: AC
Start: 2024-05-27 — End: 2024-05-27
  Filled 2024-05-27: qty 2

## 2024-05-27 MED ORDER — ONDANSETRON HCL 4 MG/2ML IJ SOLN
INTRAMUSCULAR | Status: AC
  Filled 2024-05-27: qty 2

## 2024-05-27 MED ORDER — ESMOLOL HCL 100 MG/10ML IV SOLN
INTRAVENOUS | Status: DC | PRN
  Administered 2024-05-27: 20 mg via INTRAVENOUS
  Administered 2024-05-27: 10 mg via INTRAVENOUS

## 2024-05-27 MED ORDER — BUPIVACAINE HCL (PF) 0.5 % IJ SOLN
INTRAMUSCULAR | Status: DC | PRN
  Administered 2024-05-27: 4 mL
  Administered 2024-05-27: 20 mL

## 2024-05-27 MED ORDER — PHENYLEPHRINE 80 MCG/ML (10ML) SYRINGE FOR IV PUSH (FOR BLOOD PRESSURE SUPPORT)
PREFILLED_SYRINGE | INTRAVENOUS | Status: AC
  Filled 2024-05-27: qty 10

## 2024-05-27 MED ORDER — METHOCARBAMOL 500 MG PO TABS
1000.0000 mg | ORAL_TABLET | Freq: Three times a day (TID) | ORAL | Status: AC
  Administered 2024-05-27 – 2024-05-29 (×8): 1000 mg via ORAL
  Filled 2024-05-27 (×8): qty 2

## 2024-05-27 MED ORDER — GLYCOPYRROLATE PF 0.2 MG/ML IJ SOSY
PREFILLED_SYRINGE | INTRAMUSCULAR | Status: AC
  Filled 2024-05-27: qty 1

## 2024-05-27 MED ORDER — BUPIVACAINE LIPOSOME 1.3 % IJ SUSP
INTRAMUSCULAR | Status: DC | PRN
Start: 2024-05-27 — End: 2024-05-27
  Administered 2024-05-27: 20 mL

## 2024-05-27 MED ORDER — ALBUTEROL SULFATE HFA 108 (90 BASE) MCG/ACT IN AERS
INHALATION_SPRAY | RESPIRATORY_TRACT | Status: AC
  Filled 2024-05-27: qty 6.7

## 2024-05-27 MED ORDER — ACETAMINOPHEN 325 MG PO TABS: 650.0000 mg | ORAL_TABLET | ORAL | Status: DC | PRN

## 2024-05-27 MED ORDER — BACITRACIN ZINC 500 UNIT/GM EX OINT
TOPICAL_OINTMENT | CUTANEOUS | Status: AC
Start: 2024-05-27 — End: 2024-05-27
  Filled 2024-05-27: qty 28.35

## 2024-05-27 MED ORDER — LIDOCAINE-EPINEPHRINE 1 %-1:100000 IJ SOLN
INTRAMUSCULAR | Status: AC
  Filled 2024-05-27: qty 1

## 2024-05-27 MED ORDER — DEXMEDETOMIDINE HCL IN NACL 80 MCG/20ML IV SOLN
INTRAVENOUS | Status: AC
  Filled 2024-05-27: qty 20

## 2024-05-27 MED ORDER — CEFAZOLIN SODIUM-DEXTROSE 2-4 GM/100ML-% IV SOLN
INTRAVENOUS | Status: AC
  Filled 2024-05-27: qty 100

## 2024-05-27 MED ORDER — FENTANYL CITRATE (PF) 250 MCG/5ML IJ SOLN
INTRAMUSCULAR | Status: AC
  Filled 2024-05-27: qty 5

## 2024-05-27 MED ORDER — FENTANYL CITRATE (PF) 100 MCG/2ML IJ SOLN: 25.0000 ug | INTRAMUSCULAR | Status: DC | PRN

## 2024-05-27 MED ORDER — CHLORHEXIDINE GLUCONATE 0.12 % MT SOLN: 15.0000 mL | Freq: Once | OROMUCOSAL | Status: DC

## 2024-05-27 MED ORDER — HYDROMORPHONE HCL 1 MG/ML IJ SOLN
INTRAMUSCULAR | Status: AC
  Filled 2024-05-27: qty 1

## 2024-05-27 MED ORDER — ACETAMINOPHEN 10 MG/ML IV SOLN: 1000.0000 mg | Freq: Once | INTRAVENOUS | Status: DC | PRN

## 2024-05-27 MED ORDER — PROPOFOL 10 MG/ML IV BOLUS
INTRAVENOUS | Status: AC
Start: 2024-05-27 — End: 2024-05-27
  Filled 2024-05-27: qty 20

## 2024-05-27 MED ORDER — ORAL CARE MOUTH RINSE: 15.0000 mL | Freq: Once | OROMUCOSAL | Status: DC

## 2024-05-27 MED ORDER — PROPOFOL 500 MG/50ML IV EMUL
INTRAVENOUS | Status: DC | PRN
  Administered 2024-05-27: 50 ug/kg/min via INTRAVENOUS

## 2024-05-27 MED ORDER — SODIUM CHLORIDE 0.9% FLUSH
3.0000 mL | Freq: Two times a day (BID) | INTRAVENOUS | Status: DC
  Administered 2024-05-27 – 2024-06-03 (×14): 3 mL via INTRAVENOUS

## 2024-05-27 MED ORDER — SODIUM CHLORIDE 0.9 % IV SOLN: 250.0000 mL | INTRAVENOUS | Status: AC

## 2024-05-27 MED ORDER — SUGAMMADEX SODIUM 200 MG/2ML IV SOLN
INTRAVENOUS | Status: DC | PRN
  Administered 2024-05-27: 200 mg via INTRAVENOUS

## 2024-05-27 MED ORDER — ESMOLOL HCL 100 MG/10ML IV SOLN
INTRAVENOUS | Status: AC
  Filled 2024-05-27: qty 10

## 2024-05-27 MED ORDER — FENTANYL CITRATE (PF) 250 MCG/5ML IJ SOLN
INTRAMUSCULAR | Status: DC | PRN
  Administered 2024-05-27: 100 ug via INTRAVENOUS
  Administered 2024-05-27: 150 ug via INTRAVENOUS

## 2024-05-27 MED ORDER — ONDANSETRON HCL 4 MG/2ML IJ SOLN: 4.0000 mg | Freq: Once | INTRAMUSCULAR | Status: DC | PRN

## 2024-05-27 MED ORDER — BUPIVACAINE HCL (PF) 0.5 % IJ SOLN
INTRAMUSCULAR | Status: AC
  Filled 2024-05-27: qty 30

## 2024-05-27 MED ORDER — PHENOL 1.4 % MT LIQD: 1.0000 | OROMUCOSAL | Status: DC | PRN

## 2024-05-27 MED ORDER — LIDOCAINE 2% (20 MG/ML) 5 ML SYRINGE
INTRAMUSCULAR | Status: DC | PRN
  Administered 2024-05-27: 60 mg via INTRAVENOUS

## 2024-05-27 MED ORDER — MENTHOL 3 MG MT LOZG: 1.0000 | LOZENGE | OROMUCOSAL | Status: DC | PRN

## 2024-05-27 MED ORDER — SODIUM CHLORIDE 0.9% FLUSH: 3.0000 mL | INTRAVENOUS | Status: DC | PRN

## 2024-05-27 MED ORDER — CEFAZOLIN SODIUM-DEXTROSE 2-4 GM/100ML-% IV SOLN
2.0000 g | Freq: Four times a day (QID) | INTRAVENOUS | Status: AC
Start: 2024-05-27 — End: 2024-05-27
  Administered 2024-05-27 (×2): 2 g via INTRAVENOUS
  Filled 2024-05-27 (×2): qty 100

## 2024-05-27 MED ORDER — SUCCINYLCHOLINE CHLORIDE 200 MG/10ML IV SOSY
PREFILLED_SYRINGE | INTRAVENOUS | Status: AC
  Filled 2024-05-27: qty 10

## 2024-05-27 MED ORDER — THROMBIN 5000 UNITS EX KIT
PACK | CUTANEOUS | Status: AC
Start: 2024-05-27 — End: 2024-05-27
  Filled 2024-05-27: qty 1

## 2024-05-27 MED ORDER — LACTATED RINGERS IV SOLN: INTRAVENOUS | Status: DC

## 2024-05-27 MED ORDER — BUPIVACAINE LIPOSOME 1.3 % IJ SUSP
INTRAMUSCULAR | Status: AC
  Filled 2024-05-27: qty 20

## 2024-05-27 MED ORDER — CEFAZOLIN SODIUM-DEXTROSE 2-4 GM/100ML-% IV SOLN
2.0000 g | INTRAVENOUS | Status: AC
  Administered 2024-05-27: 2 g via INTRAVENOUS

## 2024-05-27 MED ORDER — HYDROMORPHONE HCL 1 MG/ML IJ SOLN
INTRAMUSCULAR | Status: AC
  Filled 2024-05-27: qty 0.5

## 2024-05-27 MED ORDER — GADOBUTROL 1 MMOL/ML IV SOLN
6.0000 mL | Freq: Once | INTRAVENOUS | Status: AC | PRN
  Administered 2024-05-27: 6 mL via INTRAVENOUS

## 2024-05-27 MED ORDER — IPRATROPIUM-ALBUTEROL 0.5-2.5 (3) MG/3ML IN SOLN
3.0000 mL | Freq: Once | RESPIRATORY_TRACT | Status: AC
  Administered 2024-05-27: 3 mL via RESPIRATORY_TRACT

## 2024-05-27 MED ORDER — SODIUM CHLORIDE 0.9 % IV SOLN
0.1500 ug/kg/min | INTRAVENOUS | Status: AC
  Administered 2024-05-27: .05 ug/kg/min via INTRAVENOUS
  Filled 2024-05-27: qty 2000

## 2024-05-27 MED ORDER — PROPOFOL 1000 MG/100ML IV EMUL
INTRAVENOUS | Status: AC
  Filled 2024-05-27: qty 100

## 2024-05-27 MED ORDER — CHLORHEXIDINE GLUCONATE 0.12 % MT SOLN
OROMUCOSAL | Status: AC
  Filled 2024-05-27: qty 15

## 2024-05-27 MED ORDER — ALBUMIN HUMAN 5 % IV SOLN: INTRAVENOUS | Status: DC | PRN

## 2024-05-27 MED ORDER — THROMBIN 5000 UNITS EX SOLR: OROMUCOSAL | Status: DC | PRN

## 2024-05-27 MED ORDER — LIDOCAINE 5 % EX PTCH
3.0000 | MEDICATED_PATCH | CUTANEOUS | Status: DC
  Administered 2024-05-27 – 2024-06-03 (×8): 3 via TRANSDERMAL
  Filled 2024-05-27 (×8): qty 3

## 2024-05-27 MED ORDER — ONDANSETRON HCL 4 MG/2ML IJ SOLN
INTRAMUSCULAR | Status: DC | PRN
  Administered 2024-05-27: 4 mg via INTRAVENOUS

## 2024-05-27 MED ORDER — ROCURONIUM BROMIDE 10 MG/ML (PF) SYRINGE
PREFILLED_SYRINGE | INTRAVENOUS | Status: AC
  Filled 2024-05-27: qty 10

## 2024-05-27 MED ORDER — PROPOFOL 10 MG/ML IV BOLUS
INTRAVENOUS | Status: DC | PRN
  Administered 2024-05-27: 200 mg via INTRAVENOUS

## 2024-05-27 SURGICAL SUPPLY — 74 items
BAG COUNTER SPONGE SURGICOUNT (BAG) ×3 IMPLANT
BAND RUBBER #18 3X1/16 STRL (MISCELLANEOUS) ×6 IMPLANT
BASKET BONE COLLECTION (BASKET) ×3 IMPLANT
BLADE BONE MILL MEDIUM (MISCELLANEOUS) IMPLANT
BLADE CLIPPER SURG (BLADE) IMPLANT
BUR 14 MATCH 3 (BUR) IMPLANT
BUR MATCHSTICK NEURO 3.0 LAGG (BURR) ×3 IMPLANT
BUR MR8 14 BALL 5 (BUR) IMPLANT
BUR PRECISION FLUTE 5.0 (BURR) ×3 IMPLANT
CANISTER SUCTION 3000ML PPV (SUCTIONS) ×3 IMPLANT
CNTNR URN SCR LID CUP LEK RST (MISCELLANEOUS) ×3 IMPLANT
COVER BACK TABLE 60X90IN (DRAPES) ×3 IMPLANT
DERMABOND ADVANCED .7 DNX12 (GAUZE/BANDAGES/DRESSINGS) ×6 IMPLANT
DERMABOND ADVANCED .7 DNX6 (GAUZE/BANDAGES/DRESSINGS) IMPLANT
DRAIN JACKSON PRATT 10MM FLAT (MISCELLANEOUS) IMPLANT
DRAPE 3/4 80X56 (DRAPES) ×3 IMPLANT
DRAPE C-ARM 42X72 X-RAY (DRAPES) ×3 IMPLANT
DRAPE C-ARMOR (DRAPES) ×3 IMPLANT
DRAPE LAPAROTOMY 100X72X124 (DRAPES) ×3 IMPLANT
DRAPE MICROSCOPE SLANT 54X150 (MISCELLANEOUS) IMPLANT
DRAPE SHEET LG 3/4 BI-LAMINATE (DRAPES) ×12 IMPLANT
DRSG OPSITE POSTOP 3X4 (GAUZE/BANDAGES/DRESSINGS) IMPLANT
DRSG OPSITE POSTOP 4X6 (GAUZE/BANDAGES/DRESSINGS) IMPLANT
DRSG OPSITE POSTOP 4X8 (GAUZE/BANDAGES/DRESSINGS) IMPLANT
DURAPREP 26ML APPLICATOR (WOUND CARE) ×3 IMPLANT
ELECTRODE BLDE INSULATED 6.5IN (ELECTROSURGICAL) ×3 IMPLANT
ELECTRODE REM PT RTRN 9FT ADLT (ELECTROSURGICAL) ×3 IMPLANT
EVACUATOR SILICONE 100CC (DRAIN) IMPLANT
EXTENDER TAB GUIDE SV 5.5/6.0 (INSTRUMENTS) IMPLANT
FEE COVERAGE SUPPORT O-ARM (MISCELLANEOUS) ×3 IMPLANT
GAUZE 4X4 16PLY ~~LOC~~+RFID DBL (SPONGE) IMPLANT
GAUZE SPONGE 4X4 12PLY STRL (GAUZE/BANDAGES/DRESSINGS) IMPLANT
GLOVE BIO SURGEON STRL SZ7 (GLOVE) ×12 IMPLANT
GLOVE BIOGEL PI IND STRL 7.5 (GLOVE) ×6 IMPLANT
GLOVE BIOGEL PI IND STRL 8 (GLOVE) ×12 IMPLANT
GLOVE ECLIPSE 8.0 STRL XLNG CF (GLOVE) ×6 IMPLANT
GOWN STRL REUS W/ TWL LRG LVL3 (GOWN DISPOSABLE) ×3 IMPLANT
GOWN STRL REUS W/ TWL XL LVL3 (GOWN DISPOSABLE) ×6 IMPLANT
GOWN STRL REUS W/TWL 2XL LVL3 (GOWN DISPOSABLE) IMPLANT
HEMOSTAT POWDER KIT SURGIFOAM (HEMOSTASIS) ×3 IMPLANT
KIT BASIN OR (CUSTOM PROCEDURE TRAY) ×3 IMPLANT
KIT TURNOVER KIT B (KITS) ×3 IMPLANT
MARKER SKIN DUAL TIP RULER LAB (MISCELLANEOUS) IMPLANT
MARKER SPHERE PSV REFLC NDI (MISCELLANEOUS) ×15 IMPLANT
MILL BONE PREP (MISCELLANEOUS) ×3 IMPLANT
NDL HYPO 18GX1.5 BLUNT FILL (NEEDLE) IMPLANT
NDL HYPO 21X1.5 SAFETY (NEEDLE) ×3 IMPLANT
NDL HYPO 22X1.5 SAFETY MO (MISCELLANEOUS) ×3 IMPLANT
NDL SPNL 18GX3.5 QUINCKE PK (NEEDLE) IMPLANT
NEEDLE HYPO 18GX1.5 BLUNT FILL (NEEDLE) IMPLANT
NEEDLE HYPO 21X1.5 SAFETY (NEEDLE) ×2 IMPLANT
NEEDLE HYPO 22X1.5 SAFETY MO (MISCELLANEOUS) ×2 IMPLANT
NEEDLE SPNL 18GX3.5 QUINCKE PK (NEEDLE) IMPLANT
PACK LAMINECTOMY NEURO (CUSTOM PROCEDURE TRAY) ×3 IMPLANT
PAD ARMBOARD POSITIONER FOAM (MISCELLANEOUS) ×9 IMPLANT
ROD PERC CCM 5.5X65 (Rod) IMPLANT
ROD PERC CCM 5.5X70 (Rod) IMPLANT
SCREW 5.5X40MM SOLERA VOYAGER (Screw) IMPLANT
SCREW MAS VOYAGER 5.5X35 (Screw) IMPLANT
SCREW MAS VOYAGER 5.5X45 (Screw) IMPLANT
SCREW SET 5.5/6.0MM SOLERA (Screw) IMPLANT
SOLN 0.9% NACL POUR BTL 1000ML (IV SOLUTION) ×3 IMPLANT
SOLN STERILE WATER BTL 1000 ML (IV SOLUTION) ×3 IMPLANT
SPIKE FLUID TRANSFER (MISCELLANEOUS) ×3 IMPLANT
SPONGE SURGIFOAM ABS GEL 100 (HEMOSTASIS) IMPLANT
SPONGE T-LAP 4X18 ~~LOC~~+RFID (SPONGE) IMPLANT
STAPLER SKIN PROX 35W (STAPLE) ×3 IMPLANT
SUT MNCRL AB 4-0 PS2 18 (SUTURE) ×3 IMPLANT
SUT VIC AB 0 CT1 18XCR BRD8 (SUTURE) ×3 IMPLANT
SUT VIC AB 2-0 CP2 18 (SUTURE) ×3 IMPLANT
SYR 30ML LL (SYRINGE) ×3 IMPLANT
TOWEL GREEN STERILE (TOWEL DISPOSABLE) ×3 IMPLANT
TOWEL GREEN STERILE FF (TOWEL DISPOSABLE) ×3 IMPLANT
TRAY FOLEY MTR SLVR 16FR STAT (SET/KITS/TRAYS/PACK) ×3 IMPLANT

## 2024-05-27 NOTE — Anesthesia Preprocedure Evaluation (Addendum)
 Anesthesia Evaluation  Patient identified by MRN, date of birth, ID band Patient awake    Reviewed: Allergy & Precautions, NPO status , Patient's Chart, lab work & pertinent test results  Airway Mallampati: II  TM Distance: >3 FB Neck ROM: Full    Dental  (+) Missing   Pulmonary Current Smoker and Patient abstained from smoking.   Pulmonary exam normal        Cardiovascular hypertension, Pt. on medications Normal cardiovascular exam     Neuro/Psych  PSYCHIATRIC DISORDERS         GI/Hepatic negative GI ROS,,,(+)     substance abuse  alcohol use  Endo/Other  negative endocrine ROS    Renal/GU negative Renal ROS     Musculoskeletal   Abdominal   Peds  Hematology  (+) Blood dyscrasia, anemia   Anesthesia Other Findings THORACIC SPINE FRACTURE  Reproductive/Obstetrics                              Anesthesia Physical Anesthesia Plan  ASA: 3  Anesthesia Plan: General   Post-op Pain Management:    Induction: Intravenous  PONV Risk Score and Plan: 1 and Ondansetron , Midazolam, Dexamethasone  and Treatment may vary due to age or medical condition  Airway Management Planned: Oral ETT  Additional Equipment:   Intra-op Plan:   Post-operative Plan: Extubation in OR  Informed Consent: I have reviewed the patients History and Physical, chart, labs and discussed the procedure including the risks, benefits and alternatives for the proposed anesthesia with the patient or authorized representative who has indicated his/her understanding and acceptance.     Dental advisory given  Plan Discussed with: CRNA  Anesthesia Plan Comments:          Anesthesia Quick Evaluation

## 2024-05-27 NOTE — ED Notes (Signed)
 Pt urinated all over the floor for the second time. Condom catheter has been placed on pt, pt is supposed to remain supine due to spinal fractures.

## 2024-05-27 NOTE — Progress Notes (Signed)
 Orthopedic Tech Progress Note Patient Details:  Steven Arnold 1946-01-04 [id number]  Patient ID: Steven Arnold, male   DOB: 05-13-1946, 78 y.o.   MRN: [id number] LV2T. I was not needed. Steven Arnold 05/27/2024, 12:56 AM

## 2024-05-27 NOTE — Anesthesia Postprocedure Evaluation (Signed)
 Anesthesia Post Note  Patient: Steven Arnold  Procedure(s) Performed: Thoracic Eight to Thoracic Ten Posterior Percutaneous Instrumented Fusion (Back) APPLICATION OF O-ARM     Patient location during evaluation: PACU Anesthesia Type: General Level of consciousness: awake Pain management: pain level controlled Vital Signs Assessment: post-procedure vital signs reviewed and stable Respiratory status: spontaneous breathing, nonlabored ventilation and respiratory function stable Cardiovascular status: blood pressure returned to baseline and stable Postop Assessment: no apparent nausea or vomiting Anesthetic complications: no   No notable events documented.  Last Vitals:  Vitals:   05/27/24 1600 05/27/24 1619  BP: (!) 163/78 (!) 170/84  Pulse: 65 65  Resp: 14   Temp: 37.2 C 37.1 C  SpO2: 95% 90%    Last Pain:  Vitals:   05/27/24 1619  TempSrc: Oral  PainSc:                  Bernardino SQUIBB Myrtle [name]

## 2024-05-27 NOTE — ED Notes (Signed)
 Primary RN brought Consulting Civil [employer] money that was found under the pt.  Security was call and the money was counted w/ two staff members: 6 $100 dollar bills 10 $20 dollar bills 1 $10 dollar bills 5 $5 dollar bills 7 $1 dollar bills  It was put in bag # [id number]  & bag number [id number]
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
## 2024-05-27 NOTE — ED Notes (Signed)
 Patient transported to MRI

## 2024-05-27 NOTE — Anesthesia Procedure Notes (Signed)
 Procedure Name: Intubation Date/Time: 05/27/2024 11:53 AM  Performed by: Mollie Olivia SAUNDERS, CRNAPre-anesthesia Checklist: Patient identified, Emergency Drugs available, Suction available and Patient being monitored Patient Re-evaluated:Patient Re-evaluated prior to induction Oxygen Delivery Method: Circle system utilized Preoxygenation: Pre-oxygenation with 100% oxygen Induction Type: IV induction Ventilation: Mask ventilation without difficulty Laryngoscope Size: Glidescope and 4 Grade View: Grade II Tube type: Oral Number of attempts: 2 Airway Equipment and Method: Rigid stylet and Video-laryngoscopy Placement Confirmation: ETT inserted through vocal cords under direct vision, positive ETCO2 and breath sounds checked- equal and bilateral Secured at: 23 cm Tube secured with: Tape Dental Injury: Teeth and Oropharynx as per pre-operative assessment  Difficulty Due To: Difficult Airway- due to anterior larynx, Difficult Airway- due to dentition, Difficult Airway-  due to neck instability and Difficulty was anticipated Comments: Pt has a tooth in the front right that impeded our ability to angle the ETT into the anterior larynx , we tried cricoid and put more of  a curve on our rigid stylet to get the ETT to place .

## 2024-05-27 NOTE — Transfer of Care (Signed)
 Immediate Anesthesia Transfer of Care Note  Patient: Steven Arnold  Procedure(s) Performed: Thoracic Eight to Thoracic Ten Posterior Percutaneous Instrumented Fusion (Back) APPLICATION OF O-ARM  Patient Location: PACU  Anesthesia Type:General  Level of Consciousness: awake, alert , and drowsy  Airway & Oxygen Therapy: Patient Spontanous Breathing and Patient connected to face mask oxygen  Post-op Assessment: Report given to RN, Post -op Vital signs reviewed and stable, and Patient moving all extremities X 4  Post vital signs: Reviewed and stable  Last Vitals:  Vitals Value Taken Time  BP 163/77 05/27/24 14:15  Temp    Pulse 79 05/27/24 14:19  Resp 15 05/27/24 14:19  SpO2 92 % 05/27/24 14:19  Vitals shown include unfiled device data.  Last Pain:  Vitals:   05/27/24 1043  TempSrc: Oral  PainSc:          Complications: No notable events documented.

## 2024-05-27 NOTE — ED Notes (Signed)
 Shift report received, patient moved to ER 43, assumed care at this time.
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
## 2024-05-27 NOTE — Progress Notes (Signed)
 Orthopedic Tech Progress Note Patient Details:  Steven Arnold 16-Mar-1946 [id number]  Ortho Devices Type of Ortho Device: Thoracolumbar corset (TLSO) Ortho Device/Splint Interventions: Ordered, Adjustment   Post Interventions Instructions Provided: Adjustment of device, Care of device, Poper ambulation with device  [name] 05/27/2024, 2:55 PM

## 2024-05-27 NOTE — ED Notes (Signed)
 Report given to receiving RN Clotilda RN
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
## 2024-05-28 NOTE — Progress Notes (Signed)
 Transition of Care [facility]) - CAGE-AID Screening   Patient Details  Name: Steven Arnold MRN: [id number] Date of Birth: 1945/08/29  [name], RN Trauma Response Nurse Phone Number: [phone] 05/28/2024, 12:23 PM    CAGE-AID Screening:    Have You Ever Felt You Ought to Cut Down on Your Drinking or Drug Use?: No Have People Annoyed You By Critizing Your Drinking Or Drug Use?: No Have You Felt Bad Or Guilty About Your Drinking Or Drug Use?: No Have You Ever Had a Drink or Used Drugs First Thing In The Morning to Steady Your Nerves or to Get Rid of a Hangover?: No CAGE-AID Score: 0  Substance Abuse Education Offered: (S) No (pt declined services-- stating he only drinks a couple days a week)
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
## 2024-05-28 NOTE — Progress Notes (Signed)
 Transition of Care [facility]) - CAGE-AID Screening   Patient Details  Name: Steven Arnold MRN: [id number] Date of Birth: 12/01/1945   [name], RN Trauma Response Nurse Phone Number: [phone] 05/28/2024, 12:08 PM    CAGE-AID Screening:    Have You Ever Felt You Ought to Cut Down on Your Drinking or Drug Use?: No Have People Annoyed You By Critizing Your Drinking Or Drug Use?: No Have You Felt Bad Or Guilty About Your Drinking Or Drug Use?: No Have You Ever Had a Drink or Used Drugs First Thing In The Morning to Steady Your Nerves or to Get Rid of a Hangover?: No CAGE-AID Score: 0  Substance Abuse Education Offered: (S) No (pt declined services-- stating he only drinks a couple days a week)
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
## 2024-05-29 MED ORDER — ENOXAPARIN SODIUM 30 MG/0.3ML IJ SOSY
30.0000 mg | PREFILLED_SYRINGE | Freq: Two times a day (BID) | INTRAMUSCULAR | Status: DC
  Administered 2024-05-29 – 2024-06-03 (×11): 30 mg via SUBCUTANEOUS
  Filled 2024-05-29 (×11): qty 0.3
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
## 2024-05-29 NOTE — Plan of Care (Signed)
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
## 2024-05-29 NOTE — Care Management Important Message (Signed)
 Important Message  Patient Details  Name: Steven Arnold MRN: [id number] Date of Birth: Jul 28, 1946   Important Message Given:  Yes - Medicare IM     Jon Cruel 05/29/2024, 1:34 PM

## 2024-05-29 NOTE — Plan of Care (Signed)
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
## 2024-05-30 LAB — CBC
HCT: 26.1 % — ABNORMAL LOW (ref 39.0–52.0)
Hemoglobin: 8.9 g/dL — ABNORMAL LOW (ref 13.0–17.0)
MCH: 33.5 pg (ref 26.0–34.0)
MCHC: 34.1 g/dL (ref 30.0–36.0)
MCV: 98.1 fL (ref 80.0–100.0)
Platelets: 216 K/uL (ref 150–400)
RBC: 2.66 MIL/uL — ABNORMAL LOW (ref 4.22–5.81)
RDW: 13.4 % (ref 11.5–15.5)
WBC: 6.8 K/uL (ref 4.0–10.5)
nRBC: 0 % (ref 0.0–0.2)

## 2024-05-30 LAB — BASIC METABOLIC PANEL WITH GFR
Anion gap: 10 (ref 5–15)
BUN: 16 mg/dL (ref 8–23)
CO2: 26 mmol/L (ref 22–32)
Calcium: 9.6 mg/dL (ref 8.9–10.3)
Chloride: 97 mmol/L — ABNORMAL LOW (ref 98–111)
Creatinine, Ser: 0.9 mg/dL (ref 0.61–1.24)
GFR, Estimated: >60 mL/min (ref >60)
Glucose, Bld: 109 mg/dL — ABNORMAL HIGH (ref 70–99)
Potassium: 3.8 mmol/L (ref 3.5–5.1)
Sodium: 133 mmol/L — ABNORMAL LOW (ref 135–145)
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
## 2024-05-30 NOTE — Plan of Care (Signed)
 Problem: Education: Goal: Knowledge of General Education information will improve Description: Including pain rating scale, medication(s)/side effects and non-pharmacologic comfort measures 05/30/2024 1802 by Bradly Devere LABOR, RN Outcome: Progressing 05/30/2024 1637 by Bradly Devere LABOR, RN Outcome: Progressing   Problem: Health Behavior/Discharge Planning: Goal: Ability to manage health-related needs will improve 05/30/2024 1802 by Bradly Devere LABOR, RN Outcome: Progressing 05/30/2024 1637 by Bradly Devere LABOR, RN Outcome: Progressing   Problem: Clinical Measurements: Goal: Ability to maintain clinical measurements within normal limits will improve 05/30/2024 1802 by Bradly Devere LABOR, RN Outcome: Progressing 05/30/2024 1637 by Bradly Devere LABOR, RN Outcome: Progressing Goal: Will remain free from infection 05/30/2024 1802 by Bradly Devere LABOR, RN Outcome: Progressing 05/30/2024 1637 by Bradly Devere LABOR, RN Outcome: Progressing Goal: Diagnostic test results will improve 05/30/2024 1802 by Bradly Devere LABOR, RN Outcome: Progressing 05/30/2024 1637 by Bradly Devere LABOR, RN Outcome: Progressing Goal: Respiratory complications will improve 05/30/2024 1802 by Bradly Devere LABOR, RN Outcome: Progressing 05/30/2024 1637 by Bradly Devere LABOR, RN Outcome: Progressing Goal: Cardiovascular complication will be avoided 05/30/2024 1802 by Bradly Devere LABOR, RN Outcome: Progressing 05/30/2024 1637 by Bradly Devere LABOR, RN Outcome: Progressing   Problem: Activity: Goal: Risk for activity intolerance will decrease 05/30/2024 1802 by Bradly Devere LABOR, RN Outcome: Progressing 05/30/2024 1637 by Bradly Devere LABOR, RN Outcome: Progressing   Problem: Nutrition: Goal: Adequate nutrition will be maintained 05/30/2024 1802 by Bradly Devere LABOR, RN Outcome: Progressing 05/30/2024 1637 by Bradly Devere LABOR, RN Outcome: Progressing   Problem: Coping: Goal: Level of anxiety will decrease 05/30/2024 1802 by Bradly Devere LABOR, RN Outcome:  Progressing 05/30/2024 1637 by Bradly Devere LABOR, RN Outcome: Progressing   Problem: Elimination: Goal: Will not experience complications related to bowel motility 05/30/2024 1802 by Bradly Devere LABOR, RN Outcome: Progressing 05/30/2024 1637 by Bradly Devere LABOR, RN Outcome: Progressing Goal: Will not experience complications related to urinary retention 05/30/2024 1802 by Bradly Devere LABOR, RN Outcome: Progressing 05/30/2024 1637 by Bradly Devere LABOR, RN Outcome: Progressing   Problem: Pain Managment: Goal: General experience of comfort will improve and/or be controlled 05/30/2024 1802 by Bradly Devere LABOR, RN Outcome: Progressing 05/30/2024 1637 by Bradly Devere LABOR, RN Outcome: Progressing   Problem: Safety: Goal: Ability to remain free from injury will improve 05/30/2024 1802 by Bradly Devere LABOR, RN Outcome: Progressing 05/30/2024 1637 by Bradly Devere LABOR, RN Outcome: Progressing   Problem: Skin Integrity: Goal: Risk for impaired skin integrity will decrease 05/30/2024 1802 by Bradly Devere LABOR, RN Outcome: Progressing 05/30/2024 1637 by Bradly Devere LABOR, RN Outcome: Progressing   Problem: Education: Goal: Ability to verbalize activity precautions or restrictions will improve 05/30/2024 1802 by Bradly Devere LABOR, RN Outcome: Progressing 05/30/2024 1637 by Bradly Devere LABOR, RN Outcome: Progressing Goal: Knowledge of the prescribed therapeutic regimen will improve 05/30/2024 1802 by Bradly Devere LABOR, RN Outcome: Progressing 05/30/2024 1637 by Bradly Devere LABOR, RN Outcome: Progressing Goal: Understanding of discharge needs will improve 05/30/2024 1802 by Bradly Devere LABOR, RN Outcome: Progressing 05/30/2024 1637 by Bradly Devere LABOR, RN Outcome: Progressing   Problem: Activity: Goal: Ability to avoid complications of mobility impairment will improve 05/30/2024 1802 by Bradly Devere LABOR, RN Outcome: Progressing 05/30/2024 1637 by Bradly Devere LABOR, RN Outcome: Progressing Goal: Ability to tolerate increased activity will  improve 05/30/2024 1802 by Bradly Devere LABOR, RN Outcome: Progressing 05/30/2024 1637 by Bradly Devere LABOR, RN Outcome: Progressing Goal: Will remain free from falls 05/30/2024 1802 by Bradly Devere LABOR, RN Outcome: Progressing 05/30/2024 1637 by  Bradly Devere LABOR, RN Outcome: Progressing   Problem: Bowel/Gastric: Goal: Gastrointestinal status for postoperative course will improve 05/30/2024 1802 by Bradly Devere LABOR, RN Outcome: Progressing 05/30/2024 1637 by Bradly Devere LABOR, RN Outcome: Progressing   Problem: Clinical Measurements: Goal: Ability to maintain clinical measurements within normal limits will improve 05/30/2024 1802 by Bradly Devere LABOR, RN Outcome: Progressing 05/30/2024 1637 by Bradly Devere LABOR, RN Outcome: Progressing Goal: Postoperative complications will be avoided or minimized 05/30/2024 1802 by Bradly Devere LABOR, RN Outcome: Progressing 05/30/2024 1637 by Bradly Devere LABOR, RN Outcome: Progressing Goal: Diagnostic test results will improve 05/30/2024 1802 by Bradly Devere LABOR, RN Outcome: Progressing 05/30/2024 1637 by Bradly Devere LABOR, RN Outcome: Progressing   Problem: Pain Management: Goal: Pain level will decrease 05/30/2024 1802 by Bradly Devere LABOR, RN Outcome: Progressing 05/30/2024 1637 by Bradly Devere LABOR, RN Outcome: Progressing   Problem: Skin Integrity: Goal: Will show signs of wound healing 05/30/2024 1802 by Bradly Devere LABOR, RN Outcome: Progressing 05/30/2024 1637 by Bradly Devere LABOR, RN Outcome: Progressing   Problem: Health Behavior/Discharge Planning: Goal: Identification of resources available to assist in meeting health care needs will improve 05/30/2024 1802 by Bradly Devere LABOR, RN Outcome: Progressing 05/30/2024 1637 by Bradly Devere LABOR, RN Outcome: Progressing   Problem: Bladder/Genitourinary: Goal: Urinary functional status for postoperative course will improve 05/30/2024 1802 by Bradly Devere LABOR, RN Outcome: Progressing 05/30/2024 1637 by Bradly Devere LABOR, RN Outcome:  Progressing
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
## 2024-05-30 NOTE — Plan of Care (Signed)

## 2024-05-30 NOTE — Progress Notes (Signed)
 Mobility Specialist Progress Note:    05/30/24 1533  Mobility  Activity Pivoted/transferred from chair to bed  Level of Assistance Minimal assist, patient does 75% or more  Assistive Device Front wheel walker  Distance Ambulated (ft) 3 ft  Activity Response Tolerated well  Mobility Referral Yes  Mobility visit 1 Mobility  Mobility Specialist Start Time (ACUTE ONLY) 1350  Mobility Specialist Stop Time (ACUTE ONLY) 1402  Mobility Specialist Time Calculation (min) (ACUTE ONLY) 12 min   Pt received in chair requesting assistance back to bed. MinA for STS and contact guard during transfer. No c/o throughout. Pt was able to adhere to back precautions. Left in bed w/ call bell and personal belongings in reach. All needs met.   Thersia Minder Mobility Specialist  Please contact vis Secure Chat or  Rehab Office [phone]

## 2024-05-30 NOTE — Plan of Care (Signed)

## 2024-05-30 NOTE — TOC Progression Note (Addendum)
 Transition of Care [facility]) - Progression Note    Patient Details  Name: Steven Arnold MRN: [id number] Date of Birth: 24-Aug-1945  Transition of [facility]) CM/SW Contact  Tejal Monroy E Avonda Toso, LCSW Phone Number: 05/30/2024, 10:51 AM  Clinical Narrative:    Shara is approved for [facility]. Shara [id number] valid 10/29-10/31.  Kia at [facility] they do not have a bed today, possibly tomorrow. Updated RNCM and unit CSW.   Expected Discharge Plan: Skilled Nursing Facility Barriers to Discharge: Continued Medical Work up               Expected Discharge Plan and Services       Living arrangements for the past 2 months: Apartment                                       Social Drivers of Health (SDOH) Interventions SDOH Screenings   Food Insecurity: No Food Insecurity (05/27/2024)  Housing: Low Risk  (05/27/2024)  Transportation Needs: No Transportation Needs (05/27/2024)  Utilities: Not At Risk (05/27/2024)  Alcohol Screen: Low Risk  (09/28/2022)  Depression (PHQ2-9): Low Risk  (09/28/2022)  Financial Resource Strain: Low Risk  (09/28/2022)  Physical Activity: Sufficiently Active (09/28/2022)  Social Connections: Socially Isolated (05/27/2024)  Stress: No Stress Concern Present (09/28/2022)  Tobacco Use: High Risk (05/27/2024)    Readmission Risk Interventions     No data to display

## 2024-05-31 NOTE — TOC Progression Note (Signed)
 Transition of Care [facility]) - Progression Note    Patient Details  Name: Steven Arnold MRN: [id number] Date of Birth: 20-Nov-1945  Transition of Care [facility]) CM/SW Contact  Daneya Hartgrove M, RN Phone Number: 05/31/2024, 10:12 AM  Clinical Narrative:    Per Kia at [facility], there are no beds available at facility today. Insurance Authorization is good until Friday, 10/31.   No other available bed offers.    Expected Discharge Plan: Skilled Nursing Facility Barriers to Discharge: Continued Medical Work up               Expected Discharge Plan and Services       Living arrangements for the past 2 months: Apartment                                       Social Drivers of Health (SDOH) Interventions SDOH Screenings   Food Insecurity: No Food Insecurity (05/27/2024)  Housing: Low Risk  (05/27/2024)  Transportation Needs: No Transportation Needs (05/27/2024)  Utilities: Not At Risk (05/27/2024)  Alcohol Screen: Low Risk  (09/28/2022)  Depression (PHQ2-9): Low Risk  (09/28/2022)  Financial Resource Strain: Low Risk  (09/28/2022)  Physical Activity: Sufficiently Active (09/28/2022)  Social Connections: Socially Isolated (05/27/2024)  Stress: No Stress Concern Present (09/28/2022)  Tobacco Use: High Risk (05/27/2024)    Readmission Risk Interventions     No data to display         Mliss MICAEL Fass, RN, BSN  Trauma/Neuro ICU Case Manager [phone]
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
## 2024-05-31 NOTE — Progress Notes (Signed)
 SATURATION QUALIFICATIONS: (This note is used to comply with regulatory documentation for home oxygen)  Patient Saturations on Room Air at Rest = 72%  Patient Saturations on Room Air while Ambulating = NT  Patient Saturations on 6 Liters of oxygen while Ambulating = 89%  Please briefly explain why patient needs home oxygen: Pt desaturated on RA at rest. He requires supplemental oxygen to maintain SpO2 >88% at rest and with mobility. Cues for PLB technique throughout.  Randall SAUNDERS, PT, DPT Acute Rehabilitation Services Office: [phone] Secure Chat Preferred
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
## 2024-06-01 NOTE — Plan of Care (Signed)
   Problem: Education: Goal: Knowledge of General Education information will improve Description: Including pain rating scale, medication(s)/side effects and non-pharmacologic comfort measures Outcome: Progressing   Problem: Activity: Goal: Risk for activity intolerance will decrease Outcome: Progressing

## 2024-06-01 NOTE — Progress Notes (Addendum)
 Mobility Specialist Progress Note:    06/01/24 1044  Mobility  Activity Pivoted/transferred from bed to chair  Level of Assistance Minimal assist, patient does 75% or more  Assistive Device Front wheel walker  Distance Ambulated (ft) 5 ft  Activity Response Tolerated well  Mobility Referral Yes  Mobility visit 1 Mobility  Mobility Specialist Start Time (ACUTE ONLY) 1026  Mobility Specialist Stop Time (ACUTE ONLY) 1036  Mobility Specialist Time Calculation (min) (ACUTE ONLY) 10 min   Received pt in bed and agreeable to mobility. Found pt on 2 L/min O2. Pt required MinA STS, otherwise MinG. Pt able to maintain back precautions w/ brace on. No c/o. Pt left in chair with alarm on. Personal belongings and call light within reach. All needs met.  Lavanda Pollack Mobility Specialist  Please contact via [employer] or  Rehab Office [phone]
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
## 2024-06-01 NOTE — TOC Progression Note (Addendum)
 Transition of Care [facility]) - Progression Note    Patient Details  Name: Steven Arnold MRN: [id number] Date of Birth: [birth year]  Transition of Care [facility]) CM/SW Contact  Daquana Paddock E Krislynn Gronau, LCSW Phone Number: 06/01/2024, 11:03 AM  Clinical Narrative:    Reached out to [facility] - no beds today. Met with patient at bedside and provided update, patient strongly prefers [facility]. Explained that we are hopeful for a bed tomorrow, if not may need to explore [facility] or [facility].  2:50- Requested update on [facility] bed availability tomorrow.   Expected Discharge Plan: Skilled Nursing Facility Barriers to Discharge: Continued Medical Work up               Expected Discharge Plan and Services       Living arrangements for the past 2 months: Apartment                                       Social Drivers of Health (SDOH) Interventions SDOH Screenings   Food Insecurity: No Food Insecurity (05/27/2024)  Housing: Low Risk  (05/27/2024)  Transportation Needs: No Transportation Needs (05/27/2024)  Utilities: Not At Risk (05/27/2024)  Alcohol Screen: Low Risk  (09/28/2022)  Depression (PHQ2-9): Low Risk  (09/28/2022)  Financial Resource Strain: Low Risk  (09/28/2022)  Physical Activity: Sufficiently Active (09/28/2022)  Social Connections: Socially Isolated (05/27/2024)  Stress: No Stress Concern Present (09/28/2022)  Tobacco Use: High Risk (05/27/2024)    Readmission Risk Interventions     No data to display
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
## 2024-06-02 NOTE — TOC Progression Note (Addendum)
 Transition of Care [facility]) - Progression Note    Patient Details  Name: Steven Arnold MRN: [id number] Date of Birth: Dec 30, 1945  [facility] CM/SW Contact  Elsey Holts E Rasheen Schewe, LCSW Phone Number: 06/02/2024, 8:47 AM  Clinical Narrative:    Reached out to [facility] to inquire about bed availability today, awaiting reply.  9:15- Per Kia with [facility], they will have a bed for this patient tomorrow 11/1 and patient's shara will still be good tomorrow due to [facility] grace period. Updated PA and TOC handoff.   Expected Discharge Plan: Skilled Nursing Facility Barriers to Discharge: Continued Medical Work up               Expected Discharge Plan and Services       Living arrangements for the past 2 months: Apartment                                       Social Drivers of Health (SDOH) Interventions SDOH Screenings   Food Insecurity: No Food Insecurity (05/27/2024)  Housing: Low Risk  (05/27/2024)  Transportation Needs: No Transportation Needs (05/27/2024)  Utilities: Not At Risk (05/27/2024)  Alcohol Screen: Low Risk  (09/28/2022)  Depression (PHQ2-9): Low Risk  (09/28/2022)  Financial Resource Strain: Low Risk  (09/28/2022)  Physical Activity: Sufficiently Active (09/28/2022)  Social Connections: Socially Isolated (05/27/2024)  Stress: No Stress Concern Present (09/28/2022)  Tobacco Use: High Risk (05/27/2024)    Readmission Risk Interventions     No data to display
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
## 2024-06-02 NOTE — Plan of Care (Signed)
   Problem: Clinical Measurements: Goal: Will remain free from infection Outcome: Progressing   Problem: Pain Managment: Goal: General experience of comfort will improve and/or be controlled Outcome: Progressing   Problem: Safety: Goal: Ability to remain free from injury will improve Outcome: Progressing

## 2024-06-03 MED ORDER — ACETAMINOPHEN 500 MG PO TABS: 1000.0000 mg | ORAL_TABLET | Freq: Three times a day (TID) | ORAL | Status: AC | PRN

## 2024-06-03 MED ORDER — OXYCODONE HCL 5 MG PO TABS: 5.0000 mg | ORAL_TABLET | ORAL | 0 refills | Status: AC | PRN

## 2024-06-03 MED ORDER — DOCUSATE SODIUM 100 MG PO CAPS: 100.0000 mg | ORAL_CAPSULE | Freq: Two times a day (BID) | ORAL | Status: AC

## 2024-06-03 MED ORDER — MELATONIN 3 MG PO TABS
3.0000 mg | ORAL_TABLET | Freq: Every evening | ORAL | Status: AC | PRN
Start: 2024-06-03 — End: ?

## 2024-06-03 MED ORDER — POLYETHYLENE GLYCOL 3350 17 G PO PACK: 17.0000 g | PACK | Freq: Every day | ORAL | Status: AC | PRN

## 2024-06-03 MED ORDER — LIDOCAINE 5 % EX PTCH: 3.0000 | MEDICATED_PATCH | CUTANEOUS | Status: AC

## 2024-06-03 NOTE — Discharge Instructions (Signed)
 Please use your TLSO brace when out of bed

## 2024-06-03 NOTE — TOC Transition Note (Signed)
 Transition of Care [facility]) - Discharge Note   Patient Details  Name: Steven Arnold MRN: [id number] Date of Birth: 1945/11/19  Transition of [facility]) CM/SW Contact:  Bridget Cordella Simmonds, LCSW Phone Number: 06/03/2024, 12:49 PM   Clinical Narrative:   Pt discharging to [facility], room 124. RN call report to [phone].   PTAR called [phone].   1030: CSW confirmed with [facility]: they are planning to receive pt, room not ready yet. MD aware.  1225: message from [facility]: room is ready.   Final next level of care: Skilled Nursing Facility Barriers to Discharge: Barriers Resolved   Patient Goals and CMS Choice   CMS Medicare.gov Compare Post Acute Care list provided to:: Patient Choice offered to / list presented to : Patient      Discharge Placement              Patient chooses bed at: [facility] Patient to be transferred to facility by: ptar Name of family member notified: friend diane Patient and family notified of of transfer: 06/03/24  Discharge Plan and Services Additional resources added to the After Visit Summary for                                       Social Drivers of Health (SDOH) Interventions SDOH Screenings   Food Insecurity: No Food Insecurity (05/27/2024)  Housing: Low Risk  (05/27/2024)  Transportation Needs: No Transportation Needs (05/27/2024)  Utilities: Not At Risk (05/27/2024)  Alcohol Screen: Low Risk  (09/28/2022)  Depression (PHQ2-9): Low Risk  (09/28/2022)  Financial Resource Strain: Low Risk  (09/28/2022)  Physical Activity: Sufficiently Active (09/28/2022)  Social Connections: Socially Isolated (05/27/2024)  Stress: No Stress Concern Present (09/28/2022)  Tobacco Use: High Risk (05/27/2024)     Readmission Risk Interventions     No data to display

## 2024-06-03 NOTE — Progress Notes (Signed)
 Report called to [facility] @ [phone]. RN spoke with receiving nurse, Samule Pinal. All questions answered.

## 2024-06-15 NOTE — Progress Notes (Signed)
 Patient had toxic encephalopathy due to ETOH intoxication
